# Patient Record
Sex: Male | Born: 1985 | Race: White | Marital: Single | Smoking: Current some day smoker
Health system: Southern US, Community
[De-identification: ages and names within clinical notes are randomized; demographics above are authoritative.]

## PROBLEM LIST (undated history)

## (undated) DIAGNOSIS — F1020 Alcohol dependence, uncomplicated: Secondary | ICD-10-CM

## (undated) DIAGNOSIS — F909 Attention-deficit hyperactivity disorder, unspecified type: Secondary | ICD-10-CM

## (undated) HISTORY — DX: Attention-deficit hyperactivity disorder, unspecified type: F90.9

---

## 2006-07-04 ENCOUNTER — Emergency Department: Payer: Self-pay | Admitting: Emergency Medicine

## 2015-05-07 ENCOUNTER — Emergency Department: Payer: Self-pay

## 2015-05-07 ENCOUNTER — Emergency Department
Admission: EM | Admit: 2015-05-07 | Discharge: 2015-05-07 | Disposition: A | Discharge status: Home / Self Care | Payer: Self-pay | Attending: Emergency Medicine | Admitting: Emergency Medicine

## 2015-05-07 DIAGNOSIS — F10920 Alcohol use, unspecified with intoxication, uncomplicated: Secondary | ICD-10-CM

## 2015-05-07 DIAGNOSIS — R000 Tachycardia, unspecified: Secondary | ICD-10-CM | POA: Insufficient documentation

## 2015-05-07 DIAGNOSIS — F10120 Alcohol abuse with intoxication, uncomplicated: Secondary | ICD-10-CM | POA: Insufficient documentation

## 2015-05-07 LAB — ACETAMINOPHEN LEVEL: Acetaminophen (Tylenol), Serum: <10 ug/mL — ABNORMAL LOW (ref 10–30)

## 2015-05-07 LAB — COMPREHENSIVE METABOLIC PANEL
ALT: 32 U/L (ref 17–63)
ANION GAP: 8 (ref 5–15)
AST: 38 U/L (ref 15–41)
Albumin: 4.6 g/dL (ref 3.5–5.0)
Alkaline Phosphatase: 125 U/L (ref 38–126)
BUN: 11 mg/dL (ref 6–20)
CHLORIDE: 110 mmol/L (ref 101–111)
CO2: 24 mmol/L (ref 22–32)
Calcium: 8.7 mg/dL — ABNORMAL LOW (ref 8.9–10.3)
Creatinine, Ser: 0.66 mg/dL (ref 0.61–1.24)
GFR calc non Af Amer: >60 mL/min (ref >60)
Glucose, Bld: 109 mg/dL — ABNORMAL HIGH (ref 65–99)
Potassium: 4 mmol/L (ref 3.5–5.1)
SODIUM: 142 mmol/L (ref 135–145)
Total Bilirubin: 0.5 mg/dL (ref 0.3–1.2)
Total Protein: 8.3 g/dL — ABNORMAL HIGH (ref 6.5–8.1)

## 2015-05-07 LAB — URINE DRUG SCREEN, QUALITATIVE (ARMC ONLY)
Amphetamines, Ur Screen: NOT DETECTED
Barbiturates, Ur Screen: NOT DETECTED
Benzodiazepine, Ur Scrn: NOT DETECTED
Cannabinoid 50 Ng, Ur ~~LOC~~: NOT DETECTED
Cocaine Metabolite,Ur ~~LOC~~: NOT DETECTED
MDMA (Ecstasy)Ur Screen: NOT DETECTED
Methadone Scn, Ur: NOT DETECTED
Opiate, Ur Screen: NOT DETECTED
Phencyclidine (PCP) Ur S: NOT DETECTED
Tricyclic, Ur Screen: NOT DETECTED

## 2015-05-07 LAB — CBC WITH DIFFERENTIAL/PLATELET
Basophils Absolute: 0.1 10*3/uL (ref 0–0.1)
Basophils Relative: 1 %
Eosinophils Absolute: 0.1 10*3/uL (ref 0–0.7)
Eosinophils Relative: 1 %
HCT: 45.5 % (ref 40.0–52.0)
Hemoglobin: 15.2 g/dL (ref 13.0–18.0)
LYMPHS ABS: 2 10*3/uL (ref 1.0–3.6)
LYMPHS PCT: 29 %
MCH: 32 pg (ref 26.0–34.0)
MCHC: 33.4 g/dL (ref 32.0–36.0)
MCV: 95.8 fL (ref 80.0–100.0)
MONO ABS: 0.4 10*3/uL (ref 0.2–1.0)
MONOS PCT: 6 %
NEUTROS ABS: 4.4 10*3/uL (ref 1.4–6.5)
Neutrophils Relative %: 63 %
PLATELETS: 267 10*3/uL (ref 150–440)
RBC: 4.74 MIL/uL (ref 4.40–5.90)
RDW: 12.4 % (ref 11.5–14.5)
WBC: 6.9 10*3/uL (ref 3.8–10.6)

## 2015-05-07 LAB — LIPASE, BLOOD: Lipase: 120 U/L — ABNORMAL HIGH (ref 22–51)

## 2015-05-07 LAB — SALICYLATE LEVEL

## 2015-05-07 LAB — ETHANOL: Alcohol, Ethyl (B): 542 mg/dL (ref <5)

## 2015-05-07 MED ORDER — ONDANSETRON HCL 4 MG/2ML IJ SOLN
INTRAMUSCULAR | Status: AC
  Administered 2015-05-07: 4 mg
  Filled 2015-05-07: qty 2

## 2015-05-07 MED ORDER — SODIUM CHLORIDE 0.9 % IV BOLUS (SEPSIS)
1000.0000 mL | Freq: Once | INTRAVENOUS | Status: AC
  Administered 2015-05-07: 1000 mL via INTRAVENOUS

## 2015-05-07 NOTE — ED Notes (Signed)
Patient transported to CT 

## 2015-05-07 NOTE — Discharge Instructions (Signed)
Alcohol Intoxication  Alcohol intoxication occurs when you drink enough alcohol that it affects your ability to function. It can be mild or very severe. Drinking a lot of alcohol in a short time is called binge drinking. This can be very harmful. Drinking alcohol can also be more dangerous if you are taking medicines or other drugs. Some of the effects caused by alcohol may include:  · Loss of coordination.  · Changes in mood and behavior.  · Unclear thinking.  · Trouble talking (slurred speech).  · Throwing up (vomiting).  · Confusion.  · Slowed breathing.  · Twitching and shaking (seizures).  · Loss of consciousness.  HOME CARE  · Do not drive after drinking alcohol.  · Drink enough water and fluids to keep your pee (urine) clear or pale yellow. Avoid caffeine.  · Only take medicine as told by your doctor.  GET HELP IF:  · You throw up (vomit) many times.  · You do not feel better after a few days.  · You frequently have alcohol intoxication. Your doctor can help decide if you should see a substance use treatment counselor.  GET HELP RIGHT AWAY IF:  · You become shaky when you stop drinking.  · You have twitching and shaking.  · You throw up blood. It may look bright red or like coffee grounds.  · You notice blood in your poop (bowel movements).  · You become lightheaded or pass out (faint).  MAKE SURE YOU:   · Understand these instructions.  · Will watch your condition.  · Will get help right away if you are not doing well or get worse.     This information is not intended to replace advice given to you by your health care provider. Make sure you discuss any questions you have with your health care provider.     Document Released: 12/24/2007 Document Revised: 03/09/2013 Document Reviewed: 12/10/2012  Elsevier Interactive Patient Education ©2016 [employer]    Alcohol Use Disorder  Alcohol use disorder is a mental disorder. It is not a one-time incident of heavy drinking. Alcohol use disorder is the excessive  and uncontrollable use of alcohol over time that leads to problems with functioning in one or more areas of daily living. People with this disorder risk harming themselves and others when they drink to excess. Alcohol use disorder also can cause other mental disorders, such as mood and anxiety disorders, and serious physical problems. People with alcohol use disorder often misuse other drugs.   Alcohol use disorder is common and widespread. Some people with this disorder drink alcohol to cope with or escape from negative life events. Others drink to relieve chronic pain or symptoms of mental illness. People with a family history of alcohol use disorder are at higher risk of losing control and using alcohol to excess.   Drinking too much alcohol can cause injury, accidents, and health problems. One drink can be too much when you are:  · Working.  · Pregnant or breastfeeding.  · Taking medicines. Ask your doctor.  · Driving or planning to drive.  SYMPTOMS   Signs and symptoms of alcohol use disorder may include the following:   · Consumption of alcohol in larger amounts or over a longer period of time than intended.  · Multiple unsuccessful attempts to cut down or control alcohol use.    · A great deal of time spent obtaining alcohol, using alcohol, or recovering from the effects of alcohol (hangover).  · A strong desire or   urge to use alcohol (cravings).    · Continued use of alcohol despite problems at work, school, or home because of alcohol use.    · Continued use of alcohol despite problems in relationships because of alcohol use.  · Continued use of alcohol in situations when it is physically hazardous, such as driving a car.  · Continued use of alcohol despite awareness of a physical or psychological problem that is likely related to alcohol use. Physical problems related to alcohol use can involve the brain, heart, liver, stomach, and intestines. Psychological problems related to alcohol use include  intoxication, depression, anxiety, psychosis, delirium, and dementia.    · The need for increased amounts of alcohol to achieve the same desired effect, or a decreased effect from the consumption of the same amount of alcohol (tolerance).  · Withdrawal symptoms upon reducing or stopping alcohol use, or alcohol use to reduce or avoid withdrawal symptoms. Withdrawal symptoms include:  ¨ Racing heart.  ¨ Hand tremor.  ¨ Difficulty sleeping.  ¨ Nausea.  ¨ Vomiting.  ¨ Hallucinations.  ¨ Restlessness.  ¨ Seizures.  DIAGNOSIS  Alcohol use disorder is diagnosed through an assessment by your health care provider. Your health care provider may start by asking three or four questions to screen for excessive or problematic alcohol use. To confirm a diagnosis of alcohol use disorder, at least two symptoms must be present within a 12-month period. The severity of alcohol use disorder depends on the number of symptoms:  · Mild--two or three.  · Moderate--four or five.  · Severe--six or more.  Your health care provider may perform a physical exam or use results from lab tests to see if you have physical problems resulting from alcohol use. Your health care provider may refer you to a mental health professional for evaluation.  TREATMENT   Some people with alcohol use disorder are able to reduce their alcohol use to low-risk levels. Some people with alcohol use disorder need to quit drinking alcohol. When necessary, mental health professionals with specialized training in substance use treatment can help. Your health care provider can help you decide how severe your alcohol use disorder is and what type of treatment you need. The following forms of treatment are available:   · Detoxification. Detoxification involves the use of prescription medicines to prevent alcohol withdrawal symptoms in the first week after quitting. This is important for people with a history of symptoms of withdrawal and for heavy drinkers who are likely to  have withdrawal symptoms. Alcohol withdrawal can be dangerous and, in severe cases, cause death. Detoxification is usually provided in a hospital or in-patient substance use treatment facility.  · Counseling or talk therapy. Talk therapy is provided by substance use treatment counselors. It addresses the reasons people use alcohol and ways to keep them from drinking again. The goals of talk therapy are to help people with alcohol use disorder find healthy activities and ways to cope with life stress, to identify and avoid triggers for alcohol use, and to handle cravings, which can cause relapse.  · Medicines. Different medicines can help treat alcohol use disorder through the following actions:    Decrease alcohol cravings.    Decrease the positive reward response felt from alcohol use.    Produce an uncomfortable physical reaction when alcohol is used (aversion therapy).  · Support groups. Support groups are run by people who have quit drinking. They provide emotional support, advice, and guidance.  These forms of treatment are often combined. Some people with   alcohol use disorder benefit from intensive combination treatment provided by specialized substance use treatment centers. Both inpatient and outpatient treatment programs are available.     This information is not intended to replace advice given to you by your health care provider. Make sure you discuss any questions you have with your health care provider.     Document Released: 08/14/2004 Document Revised: 07/28/2014 Document Reviewed: 10/14/2012  Elsevier Interactive Patient Education ©2016 [employer]

## 2015-05-07 NOTE — ED Notes (Signed)
Pts dad found him face down in the floor, unresponsive, pt smells strongly of etoh use. Pt answers to name, unable to state what he took before going unresponsive. Does not deny drug or etoh use. VSS.

## 2015-05-07 NOTE — ED Notes (Signed)
Pt's father, Bland SpanJoe Mascaro was called to come and get the Pt. He stated that he will pick the Pt up in a couple of minutes.

## 2015-05-07 NOTE — ED Provider Notes (Addendum)
[facility] Emergency Department Provider Note  ____________________________________________  Time seen: Seen upon arrival to the emergency department  I have reviewed the triage vital signs and the nursing notes.   HISTORY  Chief Complaint Alcohol Intoxication and Altered Mental Status    HPI Billy Frost is a 29 y.o. male with a history of polysubstance abuse per EMS was presenting today with an altered level of consciousness. It is unclear when the patient was last seen normal. He was found by his father facedown on the floor this afternoon at his home.Patient unable to give any further history at this time.   No past medical history on file.  There are no active problems to display for this patient.   No past surgical history on file.  No current outpatient prescriptions on file.  Allergies Review of patient's allergies indicates not on file.  No family history on file.  Social History Social History  Substance Use Topics  . Smoking status: Not on file  . Smokeless tobacco: Not on file  . Alcohol Use: Not on file    Review of Systems  Caveat secondary to altered mental status.  ____________________________________________   PHYSICAL EXAM:  VITAL SIGNS: ED Triage Vitals  Enc Vitals Group     BP 05/07/15 1734 157/80 mmHg     Pulse Rate 05/07/15 1734 108     Resp 05/07/15 1734 18     Temp 05/07/15 1734 98.4 F (36.9 C)     Temp Source 05/07/15 1734 Oral     SpO2 05/07/15 1734 96 %     Weight 05/07/15 1734 240 lb (108.863 kg)     Height 05/07/15 1734  (1.88 m)     Head Cir --      Peak Flow --      Pain Score --      Pain Loc --      Pain Edu? --      Excl. in GC? --     Constitutional: Alert with eyes open and moving all 4 extremities spontaneously. However, appears intoxicated and not following commands. Eyes: Conjunctivae are normal. PERRL. EOMI. Head: Atraumatic. Nose: No congestion/rhinnorhea. Mouth/Throat:  Mucous membranes are moist.  Oropharynx non-erythematous. Neck: No stridor.  The tenderness to the C-spine. Cardiovascular: Tachycardic, regular rhythm. Grossly normal heart sounds.  Good peripheral circulation. Respiratory: Normal respiratory effort.  No retractions. Lungs CTAB. Gastrointestinal: Soft and nontender. No distention. No abdominal bruits. No CVA tenderness. Musculoskeletal: No lower extremity tenderness nor edema.  No joint effusions. Neurologic:  Normal speech and language. No gross focal neurologic deficits are appreciated. No gait instability. Skin:  Skin is warm, dry and intact. No rash noted. Psychiatric: Mood and affect are normal. Speech and behavior are normal.  ____________________________________________   LABS (all labs ordered are listed, but only abnormal results are displayed)  Labs Reviewed  COMPREHENSIVE METABOLIC PANEL - Abnormal; Notable for the following:    Glucose, Bld 109 (*)    Calcium 8.7 (*)    Total Protein 8.3 (*)    All other components within normal limits  LIPASE, BLOOD - Abnormal; Notable for the following:    Lipase 120 (*)    All other components within normal limits  ETHANOL - Abnormal; Notable for the following:    Alcohol, Ethyl (B) 542 (*)    All other components within normal limits  ACETAMINOPHEN LEVEL - Abnormal; Notable for the following:    Acetaminophen (Tylenol), Serum <10 (*)  All other components within normal limits  CBC WITH DIFFERENTIAL/PLATELET  URINE DRUG SCREEN, QUALITATIVE ([facility] ONLY)  SALICYLATE LEVEL   ____________________________________________  EKG  ED ECG REPORT I, Schaevitz,  Teena Iraniavid M, the attending physician, personally viewed and interpreted this ECG.   Date: 05/07/2015  EKG Time: 1724  Rate: 101  Rhythm: sinus tachycardia  Axis: Normal  Intervals:none  ST&T Change: No ST segment elevation or depression. No abnormal T-wave  inversion.  ____________________________________________  RADIOLOGY  Chest x-ray with age-indeterminate left clavicular fracture. Also with remote left rib fractures. No acute abnormality on the pelvis x-ray. CAT scan without any acute intracranial or cervical spine abnormalities. Subacute healing fracture to the middle left third clavicle. ____________________________________________   PROCEDURES    ____________________________________________   INITIAL IMPRESSION / ASSESSMENT AND PLAN / ED COURSE  Pertinent labs & imaging results that were available during my care of the patient were reviewed by me and considered in my medical decision making (see chart for details).  ----------------------------------------- 11:13 PM on 05/07/2015 -----------------------------------------  For about the past hour the patient has been up and pacing his room. We had called his father who is now at the bedside. The patient is alert and oriented 3. He notes that he does not have any clavicular pain and that this was a remote fracture. I was able to palpate both clavicles of patient having any tenderness or crepitus. His father feels comfortable taking him home at this time. The patient is now admitting to daily drinking. I will give him follow-up information for addiction services as well as a primary care doctor. I discussed the father supervising him closely overnight to make sure that he does not vomit or aspirate. The father says that he would like to take him home at this time and that he will be able to supervise him until he is sober. ____________________________________________   FINAL CLINICAL IMPRESSION(S) / ED DIAGNOSES  Alcohol intoxication.    Myrna Blazeravid Matthew Schaevitz, MD 05/07/15 2316  The patient does appear to be still clinically intoxicated but since he is awake and alert at this time is able to walk without assistance he'll be appropriate for discharge to home under his  father's supervision.  Myrna Blazeravid Matthew Schaevitz, MD 05/07/15 [phone]

## 2015-05-07 NOTE — ED Notes (Signed)
Pt ripped his IV against medical advice and tried to leave the ED. Security was called and the Pt was redirected to the room.

## 2015-05-07 NOTE — ED Notes (Signed)
Lab called with critical ETOH 542, Dr.Schaevitz aware

## 2015-05-07 NOTE — ED Notes (Signed)
Pt assisted to stand to use urinal at bedside with 2 assist. Tolerated well, speech remains slurred.

## 2015-05-07 NOTE — ED Notes (Signed)
Pt woke up and was reoriented to time and space. ED MD talked to the Pt and told him that he can not go home until he is no longer intoxicated. Pt was ambulated in the room and stumbled while walking.

## 2015-05-07 NOTE — ED Notes (Signed)
Pt smiling, denies pain, answers to his name only. Unable to state what and how much he drank at this time.

## 2015-05-17 ENCOUNTER — Emergency Department: Payer: Self-pay

## 2015-05-17 ENCOUNTER — Encounter: Payer: Self-pay | Admitting: Emergency Medicine

## 2015-05-17 ENCOUNTER — Emergency Department
Admission: EM | Admit: 2015-05-17 | Discharge: 2015-05-17 | Discharge status: Against Medical Advice | Payer: Self-pay | Attending: Emergency Medicine | Admitting: Emergency Medicine

## 2015-05-17 DIAGNOSIS — E876 Hypokalemia: Secondary | ICD-10-CM | POA: Insufficient documentation

## 2015-05-17 DIAGNOSIS — M25512 Pain in left shoulder: Secondary | ICD-10-CM | POA: Insufficient documentation

## 2015-05-17 DIAGNOSIS — M25519 Pain in unspecified shoulder: Secondary | ICD-10-CM

## 2015-05-17 DIAGNOSIS — R000 Tachycardia, unspecified: Secondary | ICD-10-CM | POA: Insufficient documentation

## 2015-05-17 DIAGNOSIS — R42 Dizziness and giddiness: Secondary | ICD-10-CM | POA: Insufficient documentation

## 2015-05-17 LAB — CBC WITH DIFFERENTIAL/PLATELET
BASOS PCT: 1 %
Basophils Absolute: 0.1 10*3/uL (ref 0–0.1)
EOS ABS: 0 10*3/uL (ref 0–0.7)
EOS PCT: 0 %
HEMATOCRIT: 45.2 % (ref 40.0–52.0)
Hemoglobin: 15.4 g/dL (ref 13.0–18.0)
Lymphocytes Relative: 10 %
Lymphs Abs: 1 10*3/uL (ref 1.0–3.6)
MCH: 32 pg (ref 26.0–34.0)
MCHC: 34.2 g/dL (ref 32.0–36.0)
MCV: 93.6 fL (ref 80.0–100.0)
MONO ABS: 0.9 10*3/uL (ref 0.2–1.0)
MONOS PCT: 9 %
Neutro Abs: 8.8 10*3/uL — ABNORMAL HIGH (ref 1.4–6.5)
Neutrophils Relative %: 80 %
PLATELETS: 232 10*3/uL (ref 150–440)
RBC: 4.83 MIL/uL (ref 4.40–5.90)
RDW: 12.3 % (ref 11.5–14.5)
WBC: 10.9 10*3/uL — ABNORMAL HIGH (ref 3.8–10.6)

## 2015-05-17 LAB — COMPREHENSIVE METABOLIC PANEL
ALBUMIN: 5 g/dL (ref 3.5–5.0)
ALK PHOS: 101 U/L (ref 38–126)
ALT: 46 U/L (ref 17–63)
AST: 54 U/L — AB (ref 15–41)
Anion gap: 16 — ABNORMAL HIGH (ref 5–15)
BILIRUBIN TOTAL: 3.5 mg/dL — AB (ref 0.3–1.2)
BUN: 18 mg/dL (ref 6–20)
CALCIUM: 9.7 mg/dL (ref 8.9–10.3)
CO2: 24 mmol/L (ref 22–32)
Chloride: 92 mmol/L — ABNORMAL LOW (ref 101–111)
Creatinine, Ser: 0.84 mg/dL (ref 0.61–1.24)
GFR calc Af Amer: >60 mL/min (ref >60)
GLUCOSE: 82 mg/dL (ref 65–99)
Potassium: 2.4 mmol/L — CL (ref 3.5–5.1)
Sodium: 132 mmol/L — ABNORMAL LOW (ref 135–145)
TOTAL PROTEIN: 9.2 g/dL — AB (ref 6.5–8.1)

## 2015-05-17 LAB — MAGNESIUM: MAGNESIUM: 2.1 mg/dL (ref 1.7–2.4)

## 2015-05-17 LAB — GLUCOSE, CAPILLARY: Glucose-Capillary: 86 mg/dL (ref 65–99)

## 2015-05-17 LAB — TROPONIN I: Troponin I: 0.03 ng/mL (ref <0.031)

## 2015-05-17 LAB — ETHANOL: Alcohol, Ethyl (B): <5 mg/dL (ref <5)

## 2015-05-17 LAB — LACTIC ACID, PLASMA
LACTIC ACID, VENOUS: 1.2 mmol/L (ref 0.5–2.0)
LACTIC ACID, VENOUS: 0.8 mmol/L (ref 0.5–2.0)

## 2015-05-17 MED ORDER — SODIUM CHLORIDE 0.9 % IV SOLN
Freq: Once | INTRAVENOUS | Status: AC
  Administered 2015-05-17: 16:00:00 via INTRAVENOUS

## 2015-05-17 MED ORDER — NAPROXEN 500 MG PO TABS: 500.0000 mg | ORAL_TABLET | Freq: Two times a day (BID) | ORAL | Status: AC

## 2015-05-17 MED ORDER — POTASSIUM CHLORIDE IN NACL 20-0.9 MEQ/L-% IV SOLN
Freq: Once | INTRAVENOUS | Status: AC
  Administered 2015-05-17: 17:00:00 via INTRAVENOUS

## 2015-05-17 MED ORDER — ONDANSETRON HCL 4 MG/2ML IJ SOLN
4.0000 mg | Freq: Once | INTRAMUSCULAR | Status: AC
  Administered 2015-05-17: 4 mg via INTRAVENOUS
  Filled 2015-05-17: qty 2

## 2015-05-17 MED ORDER — POTASSIUM CHLORIDE 20 MEQ/15ML (10%) PO SOLN
40.0000 meq | Freq: Once | ORAL | Status: AC
  Administered 2015-05-17: 40 meq via ORAL
  Filled 2015-05-17: qty 30

## 2015-05-17 MED ORDER — POTASSIUM CHLORIDE IN NACL 20-0.9 MEQ/L-% IV SOLN
Freq: Once | INTRAVENOUS | Status: DC
  Filled 2015-05-17: qty 1000

## 2015-05-17 MED ORDER — MORPHINE SULFATE (PF) 4 MG/ML IV SOLN
4.0000 mg | Freq: Once | INTRAVENOUS | Status: AC
  Administered 2015-05-17: 4 mg via INTRAVENOUS
  Filled 2015-05-17: qty 1

## 2015-05-17 MED ORDER — POTASSIUM CHLORIDE IN NACL 20-0.9 MEQ/L-% IV SOLN
INTRAVENOUS | Status: AC
  Filled 2015-05-17: qty 1000

## 2015-05-17 NOTE — ED Notes (Signed)
Patient transported to X-ray 

## 2015-05-17 NOTE — ED Notes (Signed)
Pt refusing further treatment at this time and wants to leave AMA.  Pt advised by this RN and Dr. Darnelle CatalanMalinda and Dr. Cyril LoosenKinner consequences of leaving AMA.  Pt states verbal understanding, accepting discharge paperwork, and signed AMA form.  Per Dr. Darnelle CatalanMalinda "unsure if anything is seriously wrong at this point, patient has multiple lab abnormalities, pt verbally understands consequences of leaving AMA.

## 2015-05-17 NOTE — ED Notes (Signed)
Pt here with c/o left shoulder pain since this morning, reports he broke his collar bone 1 month ago. Pt with sling in tact.

## 2015-05-17 NOTE — ED Provider Notes (Signed)
Patient is refusing to stay any longer despite his continued tachycardia and no obvious etiology. He is leaving AGAINST MEDICAL ADVICE. He understands the risk of doing so including death and disability.  Billy Everyobert Chi Garlow, MD 05/17/15 [phone]

## 2015-05-17 NOTE — ED Provider Notes (Addendum)
[facility] Emergency Department Provider Note  ____________________________________________  Time seen: Approximately 2:39 PM  I have reviewed the triage vital signs and the nursing notes.   HISTORY  Chief Complaint Shoulder Pain    HPI Billy Frost is a 29 y.o. male who was recently sent to fast track. There he was found to be tachycardic clammy and cool to his transfer to the major side. Patient reports that he fractured his left clavicle on August 24. He's been using the sling but yesterday or last night he slept on his left side woke up with a lot more pain trying to stretch his arm up and he was unable to today usually he can do so he reports a lot of pain in the area of the clavicle and more swelling there than usual. Patient also reports he is somewhat lightheaded and shaky today. Patient has a history of alcohol abuse. He has not had any alcohol for a week and a half however and was normal yesterday. Patient reports the pain in the shoulders moderately severe. Lightheadedness is mild to moderate he has no other complaints or pain or discomfort anywhere else.   History reviewed. No pertinent past medical history. Clavicle fracture There are no active problems to display for this patient.   History reviewed. No pertinent past surgical history.  Current Outpatient Rx  Name  Route  Sig  Dispense  Refill  . amphetamine-dextroamphetamine (ADDERALL) 30 MG tablet   Oral   Take 60 mg by mouth 2 (two) times daily as needed (for ADHD).           Allergies Review of patient's allergies indicates no known allergies.  No family history on file.  Social History Social History  Substance Use Topics  . Smoking status: Never Smoker   . Smokeless tobacco: None  . Alcohol Use: No   please see history of present illness   Review of Systems Constitutional: No fever/chills Eyes: No visual changes. ENT: No sore throat. Cardiovascular: Denies chest  pain. Respiratory: Denies shortness of breath. Gastrointestinal: No abdominal pain.  No nausea, no vomiting.  No diarrhea.  No constipation. Genitourinary: Negative for dysuria. Musculoskeletal: Negative for back pain. Skin: Negative for rash. Neurological: Negative for headaches, focal weakness or numbness.  10-point ROS otherwise negative.  ____________________________________________   PHYSICAL EXAM:  VITAL SIGNS: ED Triage Vitals  Enc Vitals Group     BP 05/17/15 1401 151/94 mmHg     Pulse Rate 05/17/15 1401 116     Resp 05/17/15 1401 16     Temp 05/17/15 1401 98.3 F (36.8 C)     Temp Source 05/17/15 1401 Oral     SpO2 05/17/15 1401 100 %     Weight 05/17/15 1401 229 lb (103.874 kg)     Height 05/17/15 1401 6' (1.829 m)     Head Cir --      Peak Flow --      Pain Score 05/17/15 1402 7     Pain Loc --      Pain Edu? --      Excl. in GC? --     Constitutional: Alert and oriented. Flushed and clammy and tachycardic and in no acute distress. Eyes: Conjunctivae are normal. PERRL. EOMI. Head: Atraumatic. Nose: No congestion/rhinnorhea. Mouth/Throat: Mucous membranes are moist.  Oropharynx non-erythematous. Neck: No stridor.  Cardiovascular: Normal rate, regular rhythm. Grossly normal heart sounds.  Good peripheral circulation. Respiratory: Normal respiratory effort.  No retractions. Lungs CTAB. Left  clavicle is swollen and not particularly tender to touch but hurts a lot if he attempts to move the arm. Gastrointestinal: Soft and nontender. No distention. No abdominal bruits. No CVA tenderness. Musculoskeletal: No lower extremity tenderness nor edema.  No joint effusions. Pulses are normal in both wrists. Sensation is normal in both wrists. Neurologic:  Normal speech and language. No gross focal neurologic deficits are appreciated. No gait instability. Skin:  Skin is flushed and clammy. Psychiatric: Mood and affect are normal. Speech and behavior are  normal.  ____________________________________________   LABS (all labs ordered are listed, but only abnormal results are displayed)  Labs Reviewed  COMPREHENSIVE METABOLIC PANEL - Abnormal; Notable for the following:    Sodium 132 (*)    Potassium 2.4 (*)    Chloride 92 (*)    Total Protein 9.2 (*)    AST 54 (*)    Total Bilirubin 3.5 (*)    Anion gap 16 (*)    All other components within normal limits  CBC WITH DIFFERENTIAL/PLATELET - Abnormal; Notable for the following:    WBC 10.9 (*)    Neutro Abs 8.8 (*)    All other components within normal limits  GLUCOSE, CAPILLARY  TROPONIN I  LACTIC ACID, PLASMA  LACTIC ACID, PLASMA  URINE DRUG SCREEN, QUALITATIVE ([facility] ONLY)  ETHANOL   ____________________________________________  EKG  EKG read and interpreted by me shows sinus tach at a rate of 111 heart rate on the monitor septic 128. EKG shows normal axis no acute changes. I should add that the baseline is not very good. ____________________________________________  RADIOLOGY  Chest x-ray read as healing clavicle fracture ____________________________________________   PROCEDURES    ____________________________________________   INITIAL IMPRESSION / ASSESSMENT AND PLAN / ED COURSE  Pertinent labs & imaging results that were available during my care of the patient were reviewed by me and considered in my medical decision making (see chart for details).  Patient insists he has not had any drugs or alcohol except his Adderall that he's prescribed since a week and half ago. He insists he was normal yesterday and not sure why he is tachycardic and clammy we'll give him some pain medicine and fluids and see what happens patient will be signed out to Dr. Cyril LoosenKinner.  I spoke briefly to Dr. Martha ClanKrasinski about the patient.Marland Kitchen. He feels that the x-rays are okay from the orthopedic point of view the patient can get high-dose nonsteroidals sling and follow-up in the office. I discussed  the case with Dr. Cyril LoosenKinner he will go and evaluate the patient and this I will sign the patient out to him for further evaluation. ____________________________________________ ----------------------------------------- 4:50 PM on 05/17/2015 -----------------------------------------  Patient insists he will not stay any longer in the hospital. I discussed it with Dr. Martha ClanKrasinski who feels as I mentioned before nonsteroidals in a sling will work. Dr. Clelia CroftShaw feels some B some IV potassium and fluids should improve his tachycardia and hypokalemia. He will come down and see the patient after the IV fluids if the patient will stay that long. The patient keeps threatening that he has to leave and he can spend the night. He is competent he is not intoxicated he is aware of the risk of leaving and insists that is only because he took the Adde rall that his heart rate is up. He is aware of the other lab abnormalities and says he feels fine he's always been fine and he wants to go home.   FINAL CLINICAL IMPRESSION(S) /  ED DIAGNOSES  Final diagnoses:  Tachycardia  Hypokalemia  Shoulder pain, unspecified laterality      Arnaldo Natal, MD 05/17/15 1610  Arnaldo Natal, MD 05/17/15 [phone]

## 2015-05-17 NOTE — ED Notes (Signed)
Called pharmacy to send pt medication. 

## 2015-05-17 NOTE — ED Notes (Signed)
Pt refusing to give urine sample at this time

## 2015-05-28 ENCOUNTER — Ambulatory Visit: Payer: Self-pay | Admitting: Family Medicine

## 2015-06-04 ENCOUNTER — Ambulatory Visit: Payer: Self-pay | Admitting: Family Medicine

## 2015-07-06 ENCOUNTER — Encounter: Payer: Self-pay | Admitting: Emergency Medicine

## 2015-07-06 ENCOUNTER — Other Ambulatory Visit: Payer: Self-pay

## 2015-07-06 ENCOUNTER — Emergency Department
Admission: EM | Admit: 2015-07-06 | Discharge: 2015-07-06 | Disposition: A | Discharge status: Home / Self Care | Payer: Self-pay | Attending: Emergency Medicine | Admitting: Emergency Medicine

## 2015-07-06 DIAGNOSIS — M25512 Pain in left shoulder: Secondary | ICD-10-CM | POA: Insufficient documentation

## 2015-07-06 DIAGNOSIS — F1010 Alcohol abuse, uncomplicated: Secondary | ICD-10-CM | POA: Insufficient documentation

## 2015-07-06 DIAGNOSIS — Z791 Long term (current) use of non-steroidal anti-inflammatories (NSAID): Secondary | ICD-10-CM | POA: Insufficient documentation

## 2015-07-06 DIAGNOSIS — F1110 Opioid abuse, uncomplicated: Secondary | ICD-10-CM | POA: Insufficient documentation

## 2015-07-06 LAB — COMPREHENSIVE METABOLIC PANEL
ALBUMIN: 4.6 g/dL (ref 3.5–5.0)
ALK PHOS: 101 U/L (ref 38–126)
ALT: 27 U/L (ref 17–63)
ANION GAP: 9 (ref 5–15)
AST: 30 U/L (ref 15–41)
BUN: 10 mg/dL (ref 6–20)
CALCIUM: 8.8 mg/dL — AB (ref 8.9–10.3)
CO2: 24 mmol/L (ref 22–32)
Chloride: 111 mmol/L (ref 101–111)
Creatinine, Ser: 0.73 mg/dL (ref 0.61–1.24)
GFR calc Af Amer: >60 mL/min (ref >60)
GFR calc non Af Amer: >60 mL/min (ref >60)
GLUCOSE: 99 mg/dL (ref 65–99)
Potassium: 3.9 mmol/L (ref 3.5–5.1)
SODIUM: 144 mmol/L (ref 135–145)
Total Bilirubin: 0.8 mg/dL (ref 0.3–1.2)
Total Protein: 8.6 g/dL — ABNORMAL HIGH (ref 6.5–8.1)

## 2015-07-06 LAB — URINE DRUG SCREEN, QUALITATIVE (ARMC ONLY)
Amphetamines, Ur Screen: NOT DETECTED
BARBITURATES, UR SCREEN: NOT DETECTED
Benzodiazepine, Ur Scrn: NOT DETECTED
COCAINE METABOLITE, UR ~~LOC~~: NOT DETECTED
Cannabinoid 50 Ng, Ur ~~LOC~~: NOT DETECTED
MDMA (Ecstasy)Ur Screen: NOT DETECTED
METHADONE SCREEN, URINE: NOT DETECTED
OPIATE, UR SCREEN: NOT DETECTED
Phencyclidine (PCP) Ur S: NOT DETECTED
Tricyclic, Ur Screen: NOT DETECTED

## 2015-07-06 LAB — TROPONIN I: Troponin I: <0.03 ng/mL (ref <0.031)

## 2015-07-06 LAB — CBC
HCT: 43.3 % (ref 40.0–52.0)
HEMOGLOBIN: 14.6 g/dL (ref 13.0–18.0)
MCH: 31.9 pg (ref 26.0–34.0)
MCHC: 33.7 g/dL (ref 32.0–36.0)
MCV: 94.9 fL (ref 80.0–100.0)
Platelets: 259 10*3/uL (ref 150–440)
RBC: 4.56 MIL/uL (ref 4.40–5.90)
RDW: 12.7 % (ref 11.5–14.5)
WBC: 11.1 10*3/uL — AB (ref 3.8–10.6)

## 2015-07-06 NOTE — Discharge Instructions (Signed)
Please seek medical attention for any high fevers, chest pain, shortness of breath, change in behavior, persistent vomiting, bloody stool or any other new or concerning symptoms. ° ° °Alcohol Use Disorder °Alcohol use disorder is a mental disorder. It is not a one-time incident of heavy drinking. Alcohol use disorder is the excessive and uncontrollable use of alcohol over time that leads to problems with functioning in one or more areas of daily living. People with this disorder risk harming themselves and others when they drink to excess. Alcohol use disorder also can cause other mental disorders, such as mood and anxiety disorders, and serious physical problems. People with alcohol use disorder often misuse other drugs.  °Alcohol use disorder is common and widespread. Some people with this disorder drink alcohol to cope with or escape from negative life events. Others drink to relieve chronic pain or symptoms of mental illness. People with a family history of alcohol use disorder are at higher risk of losing control and using alcohol to excess.  °Drinking too much alcohol can cause injury, accidents, and health problems. One drink can be too much when you are: °· Working. °· Pregnant or breastfeeding. °· Taking medicines. Ask your doctor. °· Driving or planning to drive. °SYMPTOMS  °Signs and symptoms of alcohol use disorder may include the following:  °· Consumption of alcohol in larger amounts or over a longer period of time than intended. °· Multiple unsuccessful attempts to cut down or control alcohol use.   °· A great deal of time spent obtaining alcohol, using alcohol, or recovering from the effects of alcohol (hangover). °· A strong desire or urge to use alcohol (cravings).   °· Continued use of alcohol despite problems at work, school, or home because of alcohol use.   °· Continued use of alcohol despite problems in relationships because of alcohol use. °· Continued use of alcohol in situations when it is  physically hazardous, such as driving a car. °· Continued use of alcohol despite awareness of a physical or psychological problem that is likely related to alcohol use. Physical problems related to alcohol use can involve the brain, heart, liver, stomach, and intestines. Psychological problems related to alcohol use include intoxication, depression, anxiety, psychosis, delirium, and dementia.   °· The need for increased amounts of alcohol to achieve the same desired effect, or a decreased effect from the consumption of the same amount of alcohol (tolerance). °· Withdrawal symptoms upon reducing or stopping alcohol use, or alcohol use to reduce or avoid withdrawal symptoms. Withdrawal symptoms include: °¨ Racing heart. °¨ Hand tremor. °¨ Difficulty sleeping. °¨ Nausea. °¨ Vomiting. °¨ Hallucinations. °¨ Restlessness. °¨ Seizures. °DIAGNOSIS °Alcohol use disorder is diagnosed through an assessment by your health care provider. Your health care provider may start by asking three or four questions to screen for excessive or problematic alcohol use. To confirm a diagnosis of alcohol use disorder, at least two symptoms must be present within a 12-month period. The severity of alcohol use disorder depends on the number of symptoms: °· Mild--two or three. °· Moderate--four or five. °· Severe--six or more. °Your health care provider may perform a physical exam or use results from lab tests to see if you have physical problems resulting from alcohol use. Your health care provider may refer you to a mental health professional for evaluation. °TREATMENT  °Some people with alcohol use disorder are able to reduce their alcohol use to low-risk levels. Some people with alcohol use disorder need to quit drinking alcohol. When necessary, mental health professionals with specialized   training in substance use treatment can help. Your health care provider can help you decide how severe your alcohol use disorder is and what type of  treatment you need. The following forms of treatment are available:  °· Detoxification. Detoxification involves the use of prescription medicines to prevent alcohol withdrawal symptoms in the first week after quitting. This is important for people with a history of symptoms of withdrawal and for heavy drinkers who are likely to have withdrawal symptoms. Alcohol withdrawal can be dangerous and, in severe cases, cause death. Detoxification is usually provided in a hospital or in-patient substance use treatment facility. °· Counseling or talk therapy. Talk therapy is provided by substance use treatment counselors. It addresses the reasons people use alcohol and ways to keep them from drinking again. The goals of talk therapy are to help people with alcohol use disorder find healthy activities and ways to cope with life stress, to identify and avoid triggers for alcohol use, and to handle cravings, which can cause relapse. °· Medicines. Different medicines can help treat alcohol use disorder through the following actions: °¨ Decrease alcohol cravings. °¨ Decrease the positive reward response felt from alcohol use. °¨ Produce an uncomfortable physical reaction when alcohol is used (aversion therapy). °· Support groups. Support groups are run by people who have quit drinking. They provide emotional support, advice, and guidance. °These forms of treatment are often combined. Some people with alcohol use disorder benefit from intensive combination treatment provided by specialized substance use treatment centers. Both inpatient and outpatient treatment programs are available. °  °This information is not intended to replace advice given to you by your health care provider. Make sure you discuss any questions you have with your health care provider. °  °Document Released: 08/14/2004 Document Revised: 07/28/2014 Document Reviewed: 10/14/2012 °Elsevier Interactive Patient Education ©2016 [employer] ° °

## 2015-07-06 NOTE — ED Provider Notes (Signed)
[facility] Emergency Department Provider Note   ____________________________________________  Time seen: 1720  I have reviewed the triage vital signs and the nursing notes.   HISTORY  Chief Complaint Medical Clearance   History limited by: Not Limited   HPI Billy Frost is a 29 y.o. male who presents to the emergency department today under police custody. The patient states that he last drank alcohol last night. He states he has recently used heroin and cocaine. Patient states that the police arrested him because of a DUI. Patient's main complaint is of left shoulder pain. Patient states that he broke his collarbone roughly 2 months ago. He states he continues to have pain. He has had no fevers. Denies any other chest pain. Denies any desire to detox from alcohol or drugs.   Past Medical History  Diagnosis Date  . ADHD (attention deficit hyperactivity disorder)     There are no active problems to display for this patient.   No past surgical history on file.  Current Outpatient Rx  Name  Route  Sig  Dispense  Refill  . amphetamine-dextroamphetamine (ADDERALL) 30 MG tablet   Oral   Take 60 mg by mouth 2 (two) times daily as needed (for ADHD).         . naproxen (NAPROSYN) 500 MG tablet   Oral   Take 1 tablet (500 mg total) by mouth 2 (two) times daily with a meal.   60 tablet   2     Allergies Review of patient's allergies indicates no known allergies.  Family History  Problem Relation Age of Onset  . Heart disease Father     Social History Social History  Substance Use Topics  . Smoking status: Never Smoker   . Smokeless tobacco: None  . Alcohol Use: Yes    Review of Systems  Constitutional: Negative for fever. Cardiovascular: Negative for chest pain. Respiratory: Negative for shortness of breath. Gastrointestinal: Negative for abdominal pain, vomiting and diarrhea. Genitourinary: Negative for dysuria. Musculoskeletal:  Negative for back pain. Positive for left shoulder pain Skin: Negative for rash. Neurological: Negative for headaches, focal weakness or numbness.  10-point ROS otherwise negative.  ____________________________________________   PHYSICAL EXAM:  VITAL SIGNS: ED Triage Vitals  Enc Vitals Group     BP 07/06/15 1628 147/83 mmHg     Pulse Rate 07/06/15 1628 110     Resp 07/06/15 1628 20     Temp 07/06/15 1628 98.6 F (37 C)     Temp Source 07/06/15 1628 Oral     SpO2 07/06/15 1628 96 %     Weight 07/06/15 1628 218 lb (98.884 kg)     Height 07/06/15 1628  (1.854 m)     Head Cir --      Peak Flow --      Pain Score 07/06/15 1641 9   Constitutional: Awake and alert. Appears intoxicated. Eyes: Conjunctivae are normal. PERRL. Normal extraocular movements. ENT   Head: Normocephalic and atraumatic.   Nose: No congestion/rhinnorhea.   Mouth/Throat: Mucous membranes are moist.   Neck: No stridor. Hematological/Lymphatic/Immunilogical: No cervical lymphadenopathy. Cardiovascular: Normal rate, regular rhythm.  No murmurs, rubs, or gallops. Respiratory: Normal respiratory effort without tachypnea nor retractions. Breath sounds are clear and equal bilaterally. No wheezes/rales/rhonchi. Gastrointestinal: Soft and nontender. No distention.  Genitourinary: Deferred Musculoskeletal: Normal range of motion in all extremities. No joint effusions.  No lower extremity tenderness nor edema. Neurologic:  Normal speech and language. No gross focal neurologic  deficits are appreciated.  Skin:  Skin is warm, dry and intact. No rash noted. ____________________________________________    LABS (pertinent positives/negatives)  Labs Reviewed  CBC - Abnormal; Notable for the following:    WBC 11.1 (*)    All other components within normal limits  COMPREHENSIVE METABOLIC PANEL - Abnormal; Notable for the following:    Calcium 8.8 (*)    Total Protein 8.6 (*)    All other components  within normal limits  TROPONIN I  URINE DRUG SCREEN, QUALITATIVE ([facility] ONLY)     ____________________________________________   EKG  I, Phineas SemenGraydon Denetta Fei, attending physician, personally viewed and interpreted this EKG  EKG Time: 1646 Rate: 107 Rhythm: sinus tachycardia Axis: left axis deviation Intervals: qtc 445 QRS: narrow ST changes: no st elevation Impression: abnormal ekg ____________________________________________    RADIOLOGY  None  ____________________________________________   PROCEDURES  Procedure(s) performed: None  Critical Care performed: No  ____________________________________________   INITIAL IMPRESSION / ASSESSMENT AND PLAN / ED COURSE  Pertinent labs & imaging results that were available during my care of the patient were reviewed by me and considered in my medical decision making (see chart for details).  Patient presented to the emergency department and a police custody complaining primarily of continued left shoulder pain where he had a broken collarbone. On exam patient in no acute distress although he does appear to be slightly intoxicated. Blood work without any overly concerning findings. Will discharge to police custody.  ____________________________________________   FINAL CLINICAL IMPRESSION(S) / ED DIAGNOSES  Final diagnoses:  Alcohol abuse  Heroin abuse  Left shoulder pain     Phineas SemenGraydon Sylus Stgermain, MD 07/06/15 1731

## 2015-07-06 NOTE — ED Notes (Signed)
Patient presents to the ED in police custody.  Patient reports using meth, cocaine, and heroin at 4am this morning.  Patient reports chest pain but also reports history of a broken collar bone 2 months ago.  Patient is not cooperative or behaving appropriately.

## 2017-01-25 IMAGING — CR DG CHEST 1V
1 series · 1 of 1 positions shown · non-contrast
Comparison: None.

CLINICAL DATA: Fall

EXAM:
CHEST 1 VIEW

[dg chest 1 view]
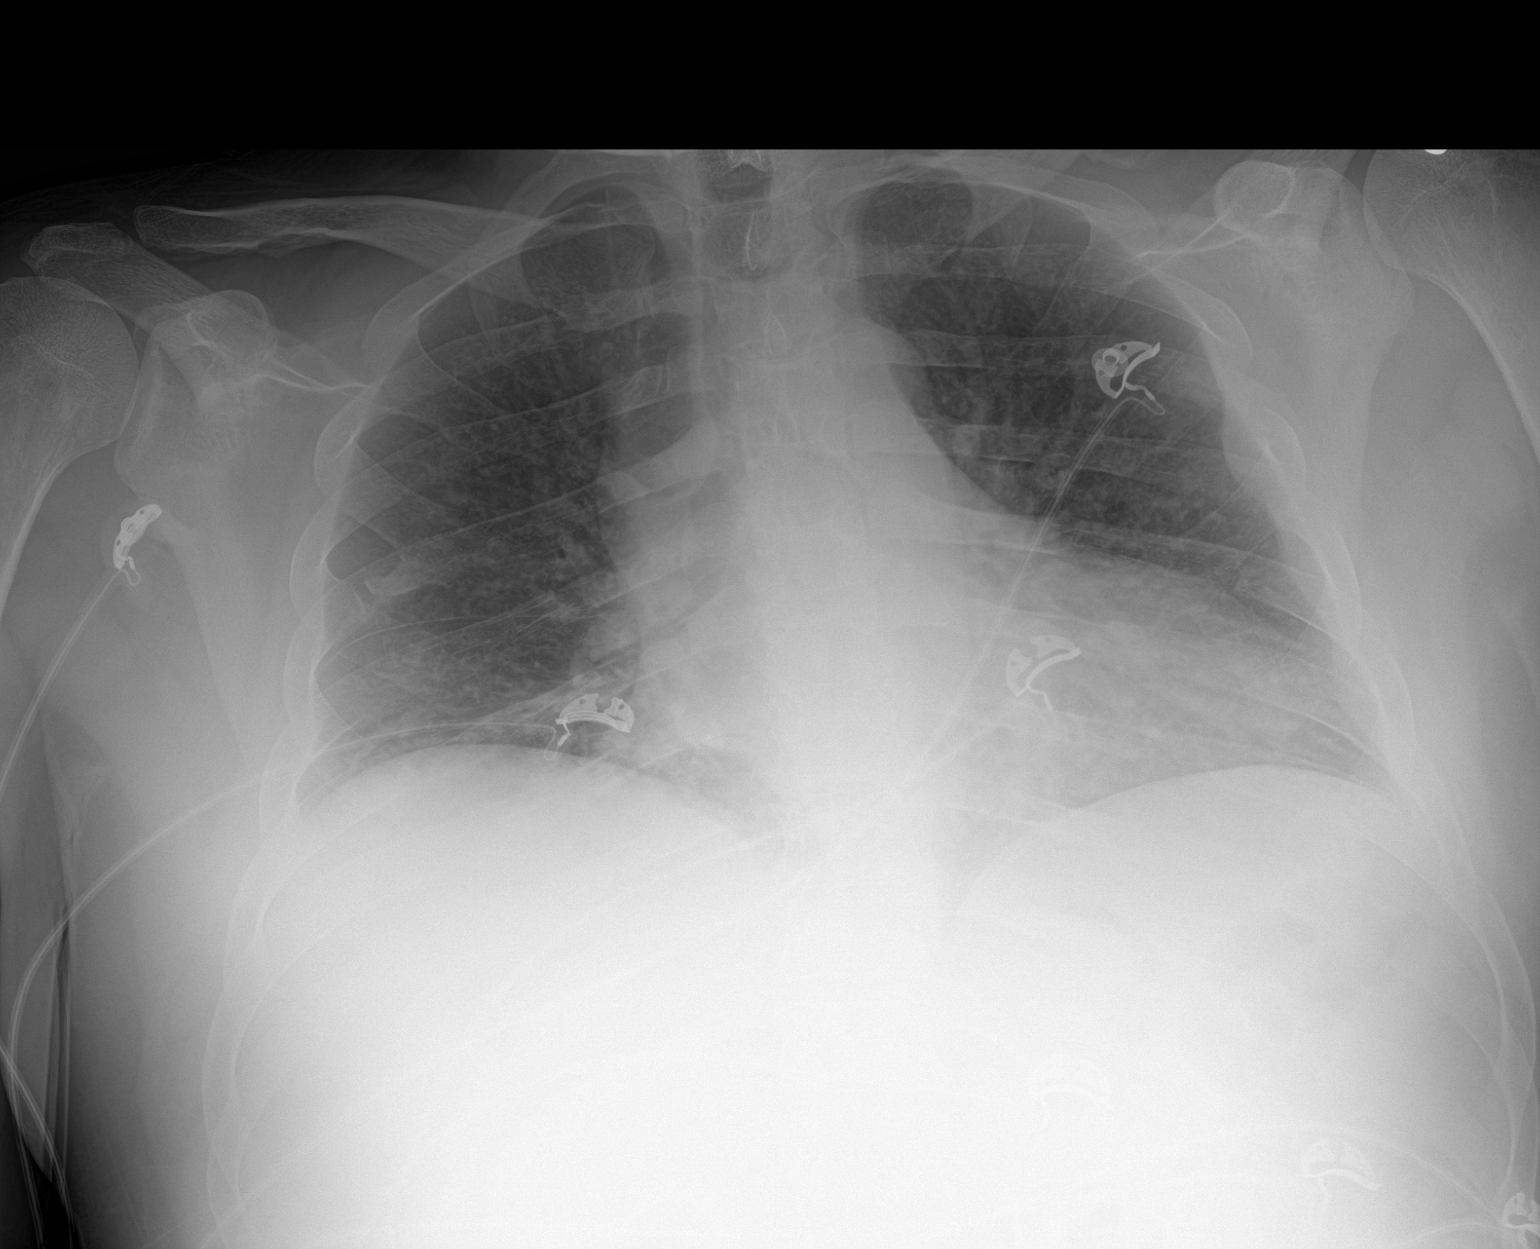

[1 of 1 positions shown; findings below may reference images not displayed]

FINDINGS: Low lung volumes. Cardiac silhouette is enlarged. No pneumothorax.
Fracture of the LEFT clavicle midshaft with override. There appears
to be callus formation. Probable remote rib fractures on the LEFT on
also indeterminate.
IMPRESSION: 1. Age indeterminate LEFT clavicle fracture. Recommend clinical
correlation.
2. Probable remote rib fractures and LEFT lateral chest wall.
3. Low lung volumes and cardiomegaly.

## 2017-01-25 IMAGING — CT CT HEAD W/O CM
2 of 3 series · 14 of 30 positions shown, 17 images · non-contrast
Comparison: CT head 07/04/2006

CLINICAL DATA: Found based on on floor, unresponsive, smells
strongly of alcohol

EXAM:
CT HEAD WITHOUT CONTRAST
CT CERVICAL SPINE WITHOUT CONTRAST
TECHNIQUE: Multidetector CT imaging of the head and cervical spine was
performed following the standard protocol without intravenous
contrast. Multiplanar CT image reconstructions of the cervical spine
were also generated.

[Series 2: head wo · axial · 0.44mm/px · z∈[-116,-67]mm · 2 of 34 slices shown]
[im 12/34  brain]
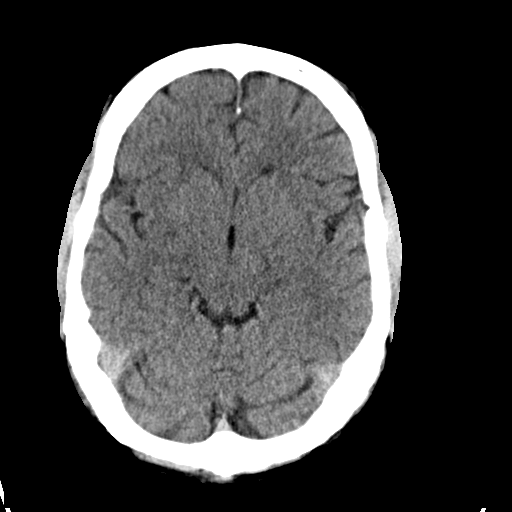
[im 23/34  brain]
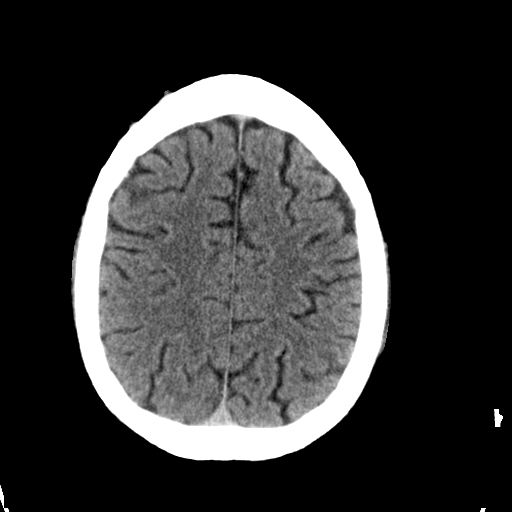

[Series 9: orthogonal axials · axial · 0.18mm/px · z∈[-328,-173]mm · 12 of 101 slices shown, 15 images]
[im 8/101  brain]
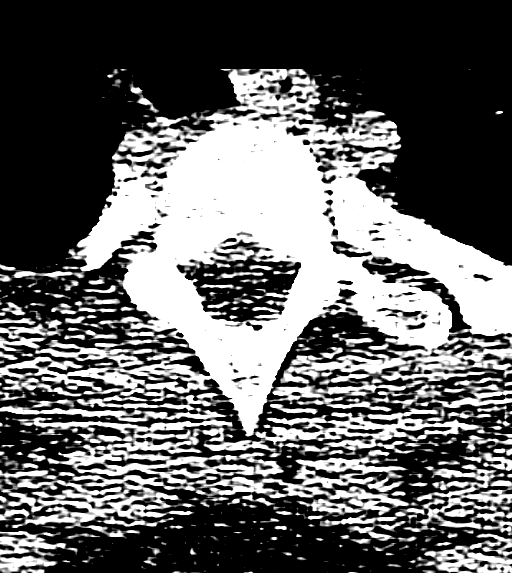
[im 8/101  bone]
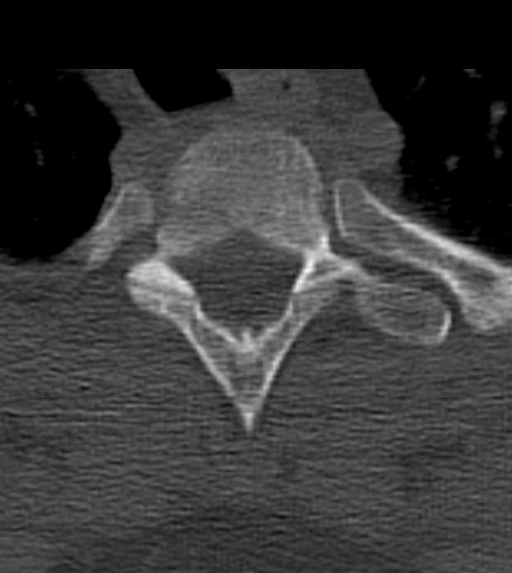
[im 16/101  brain]
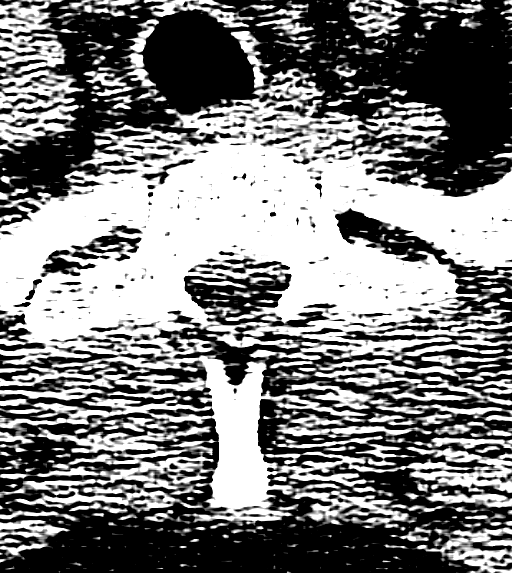
[im 24/101  brain]
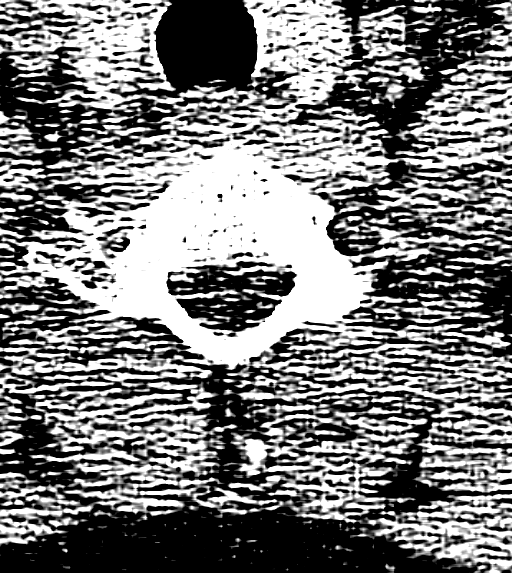
[im 31/101  brain]
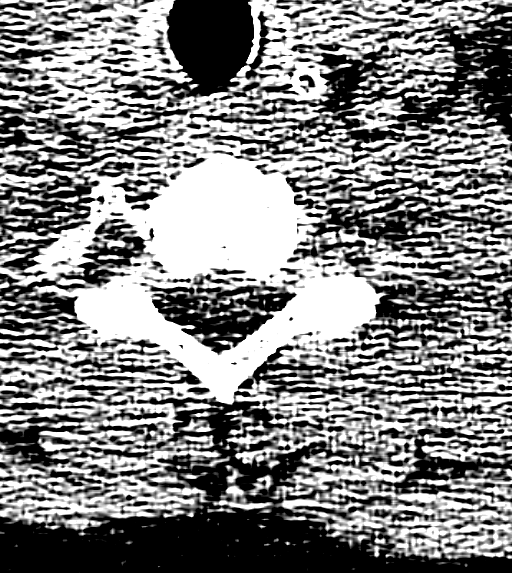
[im 39/101  brain]
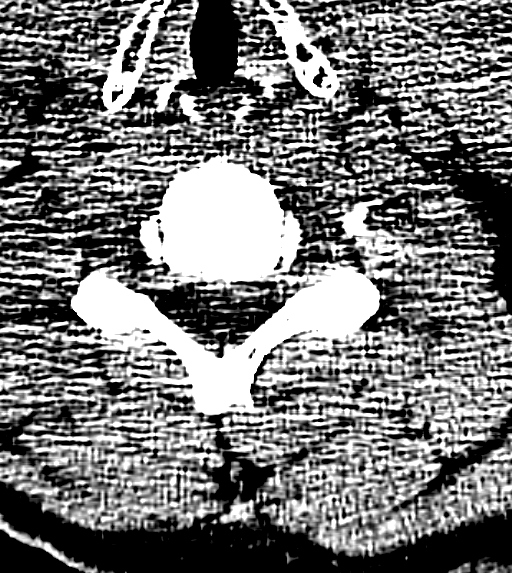
[im 39/101  bone]
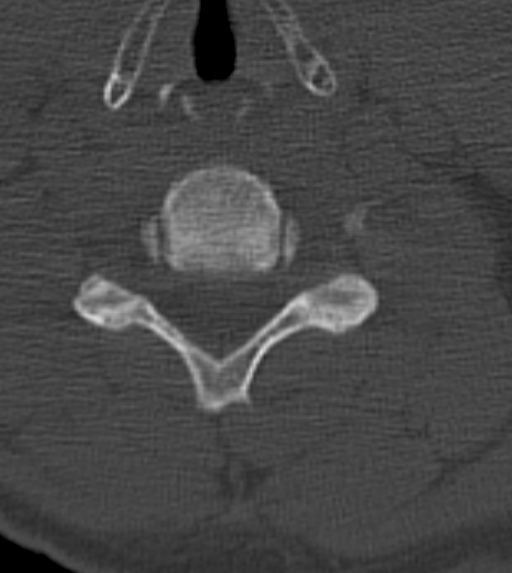
[im 47/101  brain]
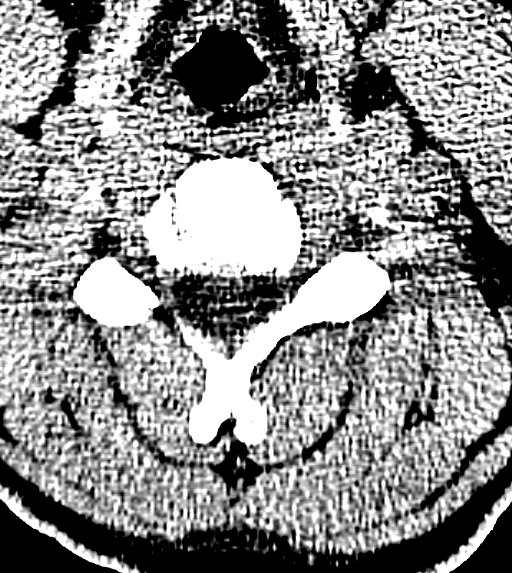
[im 54/101  brain]
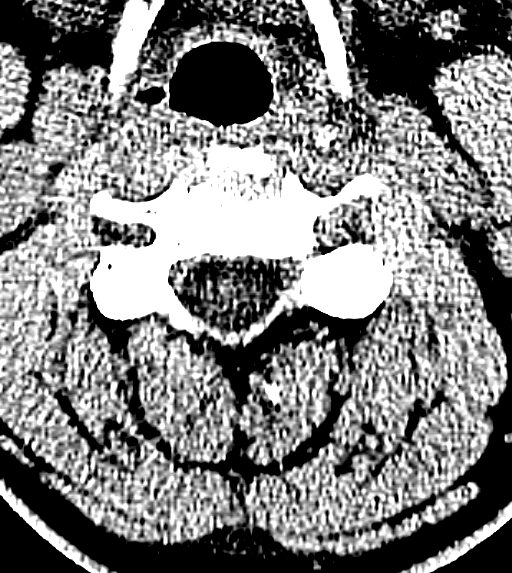
[im 62/101  brain]
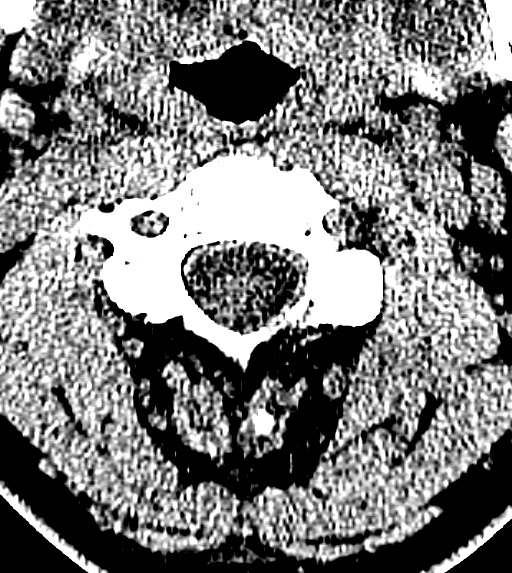
[im 70/101  brain]
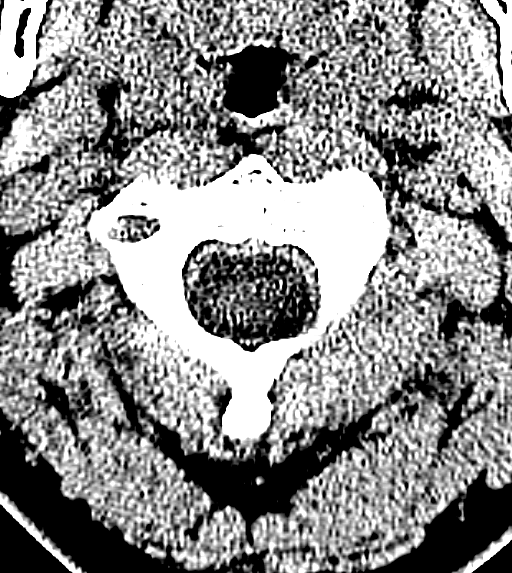
[im 70/101  bone]
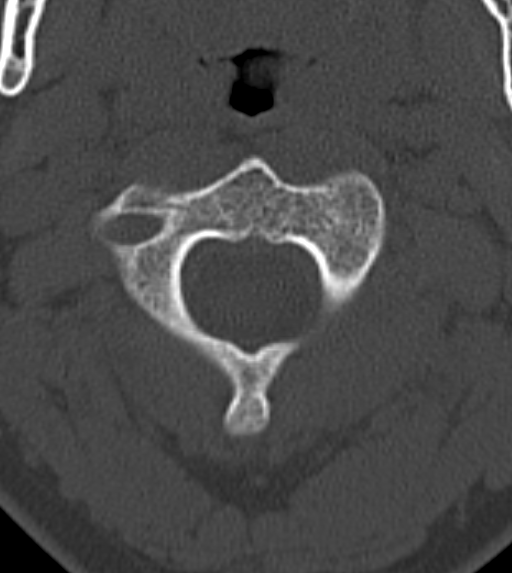
[im 77/101  brain]
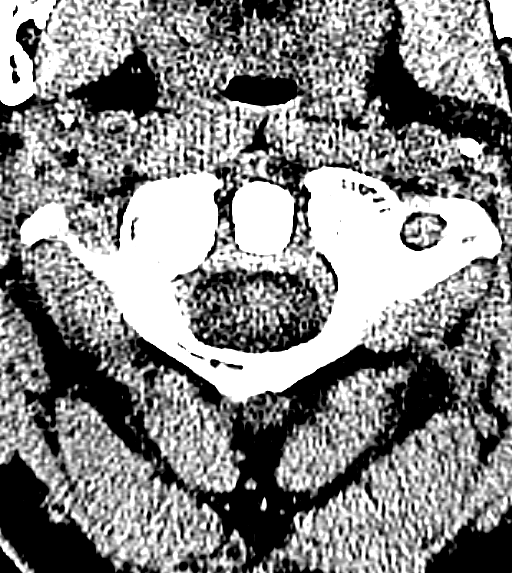
[im 85/101  brain]
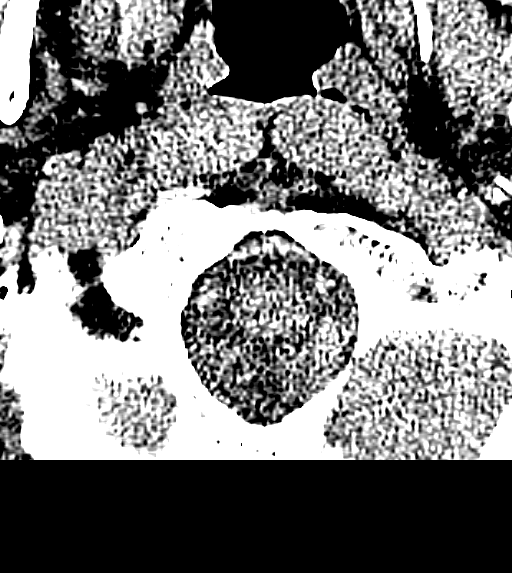
[im 93/101  brain]
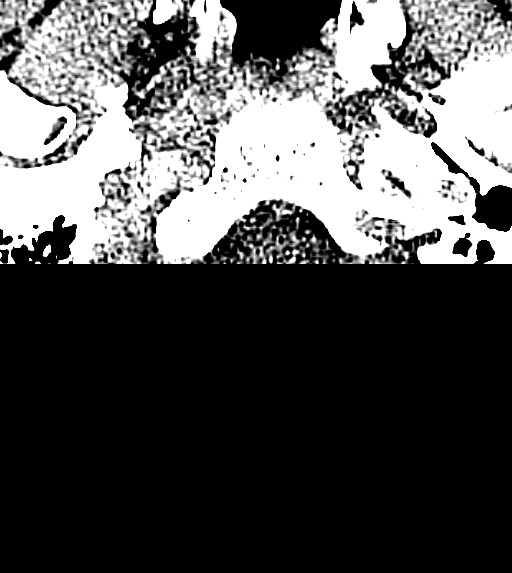

[14 of 30 positions shown; findings below may reference images not displayed]

FINDINGS: CT HEAD FINDINGS

Normal ventricular morphology.

No midline shift or mass effect.

Normal appearance of brain parenchyma.

No intracranial hemorrhage, mass lesion or evidence acute
infarction.

No extra-axial fluid collections.

Sinuses clear and bones unremarkable.

CT CERVICAL SPINE FINDINGS

Displaced fracture middle third LEFT clavicle, subacute/healing.

Visualized skullbase intact.

Prevertebral soft tissues normal thickness.

Vertebral body and disc space heights maintained.

No acute fracture, subluxation or bone destruction.

Lung apices clear.
IMPRESSION: No acute intracranial abnormalities.

No acute cervical spine abnormalities.

Subacute/healing fracture middle third LEFT clavicle.

## 2017-04-09 ENCOUNTER — Emergency Department
Admission: EM | Admit: 2017-04-09 | Discharge: 2017-04-09 | Disposition: A | Discharge status: Home / Self Care | Payer: Self-pay | Attending: Emergency Medicine | Admitting: Emergency Medicine

## 2017-04-09 ENCOUNTER — Encounter: Payer: Self-pay | Admitting: Emergency Medicine

## 2017-04-09 DIAGNOSIS — F1994 Other psychoactive substance use, unspecified with psychoactive substance-induced mood disorder: Secondary | ICD-10-CM

## 2017-04-09 DIAGNOSIS — F10920 Alcohol use, unspecified with intoxication, uncomplicated: Secondary | ICD-10-CM | POA: Insufficient documentation

## 2017-04-09 DIAGNOSIS — R45851 Suicidal ideations: Secondary | ICD-10-CM | POA: Insufficient documentation

## 2017-04-09 DIAGNOSIS — F918 Other conduct disorders: Secondary | ICD-10-CM | POA: Insufficient documentation

## 2017-04-09 DIAGNOSIS — F10929 Alcohol use, unspecified with intoxication, unspecified: Secondary | ICD-10-CM

## 2017-04-09 HISTORY — DX: Alcohol dependence, uncomplicated: F10.20

## 2017-04-09 LAB — CBC WITH DIFFERENTIAL/PLATELET
BASOS PCT: 1 %
Basophils Absolute: 0.1 10*3/uL (ref 0–0.1)
EOS ABS: 0 10*3/uL (ref 0–0.7)
Eosinophils Relative: 0 %
HCT: 42.6 % (ref 40.0–52.0)
Hemoglobin: 14.6 g/dL (ref 13.0–18.0)
Lymphocytes Relative: 19 %
Lymphs Abs: 2.3 10*3/uL (ref 1.0–3.6)
MCH: 30.9 pg (ref 26.0–34.0)
MCHC: 34.2 g/dL (ref 32.0–36.0)
MCV: 90.4 fL (ref 80.0–100.0)
MONOS PCT: 9 %
Monocytes Absolute: 1 10*3/uL (ref 0.2–1.0)
NEUTROS PCT: 71 %
Neutro Abs: 8.3 10*3/uL — ABNORMAL HIGH (ref 1.4–6.5)
Platelets: 345 10*3/uL (ref 150–440)
RBC: 4.71 MIL/uL (ref 4.40–5.90)
RDW: 13.2 % (ref 11.5–14.5)
WBC: 11.7 10*3/uL — AB (ref 3.8–10.6)

## 2017-04-09 LAB — COMPREHENSIVE METABOLIC PANEL
ALK PHOS: 120 U/L (ref 38–126)
ALT: 45 U/L (ref 17–63)
ANION GAP: 16 — AB (ref 5–15)
AST: 71 U/L — ABNORMAL HIGH (ref 15–41)
Albumin: 4.7 g/dL (ref 3.5–5.0)
BILIRUBIN TOTAL: 1.9 mg/dL — AB (ref 0.3–1.2)
BUN: 8 mg/dL (ref 6–20)
CALCIUM: 9 mg/dL (ref 8.9–10.3)
CO2: 22 mmol/L (ref 22–32)
Chloride: 102 mmol/L (ref 101–111)
Creatinine, Ser: 0.52 mg/dL — ABNORMAL LOW (ref 0.61–1.24)
Glucose, Bld: 119 mg/dL — ABNORMAL HIGH (ref 65–99)
Potassium: 3.4 mmol/L — ABNORMAL LOW (ref 3.5–5.1)
SODIUM: 140 mmol/L (ref 135–145)
TOTAL PROTEIN: 8.2 g/dL — AB (ref 6.5–8.1)

## 2017-04-09 LAB — URINE DRUG SCREEN, QUALITATIVE (ARMC ONLY)
Amphetamines, Ur Screen: NOT DETECTED
BARBITURATES, UR SCREEN: NOT DETECTED
BENZODIAZEPINE, UR SCRN: POSITIVE — AB
CANNABINOID 50 NG, UR ~~LOC~~: NOT DETECTED
COCAINE METABOLITE, UR ~~LOC~~: NOT DETECTED
MDMA (Ecstasy)Ur Screen: NOT DETECTED
METHADONE SCREEN, URINE: NOT DETECTED
Opiate, Ur Screen: NOT DETECTED
Phencyclidine (PCP) Ur S: NOT DETECTED
TRICYCLIC, UR SCREEN: NOT DETECTED

## 2017-04-09 LAB — ETHANOL: ALCOHOL ETHYL (B): 434 mg/dL — AB (ref <5)

## 2017-04-09 MED ORDER — LORAZEPAM 2 MG/ML IJ SOLN
2.0000 mg | Freq: Once | INTRAMUSCULAR | Status: AC
  Administered 2017-04-09: 2 mg via INTRAMUSCULAR

## 2017-04-09 MED ORDER — ZIPRASIDONE MESYLATE 20 MG IM SOLR
INTRAMUSCULAR | Status: AC
  Administered 2017-04-09: 20 mg via INTRAMUSCULAR
  Filled 2017-04-09: qty 20

## 2017-04-09 MED ORDER — LORAZEPAM 2 MG/ML IJ SOLN
INTRAMUSCULAR | Status: AC
  Administered 2017-04-09: 2 mg via INTRAMUSCULAR
  Filled 2017-04-09: qty 1

## 2017-04-09 MED ORDER — ZIPRASIDONE MESYLATE 20 MG IM SOLR
20.0000 mg | Freq: Once | INTRAMUSCULAR | Status: AC
  Administered 2017-04-09: 20 mg via INTRAMUSCULAR

## 2017-04-09 NOTE — ED Provider Notes (Signed)
-----------------------------------------   8:22 PM on 04/09/2017 -----------------------------------------   Blood pressure 133/80, pulse 95, temperature 98.5 F (36.9 C), temperature source Oral, resp. rate 20, weight 98.9 kg (218 lb), SpO2 95 %.  The patient had no acute events since last update.  Calm and cooperative at this time.  Patient is awake and alert. Able to ambulate with a normal gait. Clinically sober. Says that he will be getting a ride home with his girlfriend. He is denying any suicidal or homicidal ideation. He had been evaluated by Dr. Weber Cooks and psychiatry earlier do not believe the patient met criteria for inpatient stay.     Billy Pyo, MD 04/09/17 [phone]

## 2017-04-09 NOTE — Consult Note (Signed)
[place] [facility] Consult   Reason for Consult:  Consult for 31 year old man who presented to the emergency room under IVC Referring Physician:  [place] Patient Identification: Billy Frost MRN:  [id number] Principal Diagnosis: Substance induced mood disorder [facility]) Diagnosis:   Patient Active Problem List   Diagnosis Date Noted  . Alcohol intoxication ([place]) [F10.929] 04/09/2017  . Substance induced mood disorder [facility]) [F19.94] 04/09/2017    Total Time spent with patient: 1 hour  Subjective:   Billy Frost is a 31 y.o. male patient admitted with "I want to go home".  HPI:  Patient brought to the emergency room under commitment filed by his family. Commitment paper alleges that he had been aggressive and made suicidal statements. Patient was grossly intoxicated on arrival with a blood alcohol level measured about 430. When I saw the patient he was still obviously intoxicated. He was wondering around his room with his pants off. He had urinated on the floor. Very confused. Slurred speech. Nevertheless he proudly told me "I am not drunk". Patient is not the most clear historian but was able to tell me that he had recently been released from prison just about 4 or 5 days ago. He had immediately gone back to heavy abuse of alcohol and possibly other drugs. He tells me that he drinks "not very much". Again not very reliable. Couldn't tell me how much she had today. Family said that he had made suicidal statements. Patient completely denied it.  Social history: Just released from prison. 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 I doubt very much that he has actually gotten back into regular employment yet. Sounds like he is living in an apartment.  Medical history: No known significant medical problems outside of the alcohol abuse and a past history of fractured collar bone.  Substance abuse history: Long-standing alcohol abuse. Multiple prior visits to the emergency room before  his prison stay all for alcohol abuse or injuries related to alcohol abuse. Patient tells me he does not think he is ever had a seizure or delirium tremens. He does tell me that he has abused several other drugs including cocaine and opiates in the past. He is uncertain whether he's been using any of that now. When discussing his past diagnosis of ADHD and having been prescribed Adderall he tells me "man I really love that stuff, I get super high when I take it".  Past Psychiatric History: Unclear if he is ever really engaged in substance abuse treatment. Patient has no known history of involvement in substance abuse treatment or attempts to get sober in the past. Denies any past suicide attempts. He had been treated for ADHD in the past. As I noted above today the patient told me that he loved taking stimulants including Adderall because he would abuse them and get high with him.  Risk to Self: Is patient at risk for suicide?: No Risk to Others:   Prior Inpatient Therapy:   Prior Outpatient Therapy:    Past Medical History:  Past Medical History:  Diagnosis Date  . ADHD (attention deficit hyperactivity disorder)   . Alcoholism ([place])    History reviewed. No pertinent surgical history. Family History:  Family History  Problem Relation Age of Onset  . Heart disease Father    Family Psychiatric  History: Denies knowing of any family history Social History:  History  Alcohol Use  . Yes     History  Drug Use  . Types: Cocaine, Methamphetamines  Comment: heroin    Social History   Social History  . Marital status: Single    Spouse name: N/A  . Number of children: N/A  . Years of education: N/A   Social History Main Topics  . Smoking status: Never Smoker  . Smokeless tobacco: Never Used  . Alcohol use Yes  . Drug use: Yes    Types: Cocaine, Methamphetamines     Comment: heroin  . Sexual activity: Not Asked   Other Topics Concern  . None   Social History Narrative  .  None   Additional Social History:    Allergies:  No Known Allergies  Labs:  Results for orders placed or performed during the hospital encounter of 04/09/17 (from the past 48 hour(s))  Comprehensive metabolic panel     Status: Abnormal   Collection Time: 04/09/17 12:29 PM  Result Value Ref Range   Sodium 140 135 - 145 mmol/L   Potassium 3.4 (L) 3.5 - 5.1 mmol/L   Chloride 102 101 - 111 mmol/L   CO2 22 22 - 32 mmol/L   Glucose, Bld 119 (H) 65 - 99 mg/dL   BUN 8 6 - 20 mg/dL   Creatinine, Ser 0.52 (L) 0.61 - 1.24 mg/dL   Calcium 9.0 8.9 - 10.3 mg/dL   Total Protein 8.2 (H) 6.5 - 8.1 g/dL   Albumin 4.7 3.5 - 5.0 g/dL   AST 71 (H) 15 - 41 U/L   ALT 45 17 - 63 U/L   Alkaline Phosphatase 120 38 - 126 U/L   Total Bilirubin 1.9 (H) 0.3 - 1.2 mg/dL   GFR calc non Af Amer >60 >60 mL/min   GFR calc Af Amer >60 >60 mL/min    Comment: (NOTE) The eGFR has been calculated using the CKD EPI equation. This calculation has not been validated in all clinical situations. eGFR's persistently <60 mL/min signify possible Chronic Kidney Disease.    Anion gap 16 (H) 5 - 15  Ethanol     Status: Abnormal   Collection Time: 04/09/17 12:29 PM  Result Value Ref Range   Alcohol, Ethyl (B) 434 (HH) <5 mg/dL    Comment: CRITICAL RESULT CALLED TO, READ BACK BY AND VERIFIED WITH TINA CARR 04/09/17 1317 KLW        LOWEST DETECTABLE LIMIT FOR SERUM ALCOHOL IS 5 mg/dL FOR MEDICAL PURPOSES ONLY   CBC with Diff     Status: Abnormal   Collection Time: 04/09/17 12:29 PM  Result Value Ref Range   WBC 11.7 (H) 3.8 - 10.6 K/uL   RBC 4.71 4.40 - 5.90 MIL/uL   Hemoglobin 14.6 13.0 - 18.0 g/dL   HCT 42.6 40.0 - 52.0 %   MCV 90.4 80.0 - 100.0 fL   MCH 30.9 26.0 - 34.0 pg   MCHC 34.2 32.0 - 36.0 g/dL   RDW 13.2 11.5 - 14.5 %   Platelets 345 150 - 440 K/uL   Neutrophils Relative % 71 %   Neutro Abs 8.3 (H) 1.4 - 6.5 K/uL   Lymphocytes Relative 19 %   Lymphs Abs 2.3 1.0 - 3.6 K/uL   Monocytes Relative 9 %    Monocytes Absolute 1.0 0.2 - 1.0 K/uL   Eosinophils Relative 0 %   Eosinophils Absolute 0.0 0 - 0.7 K/uL   Basophils Relative 1 %   Basophils Absolute 0.1 0 - 0.1 K/uL  Urine Drug Screen, Qualitative     Status: Abnormal   Collection Time: 04/09/17  7:00 PM  Result Value Ref Range   Tricyclic, Ur Screen NONE DETECTED NONE DETECTED   Amphetamines, Ur Screen NONE DETECTED NONE DETECTED   MDMA (Ecstasy)Ur Screen NONE DETECTED NONE DETECTED   Cocaine Metabolite,Ur [place] NONE DETECTED NONE DETECTED   Opiate, Ur Screen NONE DETECTED NONE DETECTED   Phencyclidine (PCP) Ur S NONE DETECTED NONE DETECTED   Cannabinoid 50 Ng, Ur [place] NONE DETECTED NONE DETECTED   Barbiturates, Ur Screen NONE DETECTED NONE DETECTED   Benzodiazepine, Ur Scrn POSITIVE (A) NONE DETECTED   Methadone Scn, Ur NONE DETECTED NONE DETECTED    Comment: (NOTE) 683  Tricyclics, urine               Cutoff 1000 ng/mL 200  Amphetamines, urine             Cutoff 1000 ng/mL 300  MDMA (Ecstasy), urine           Cutoff 500 ng/mL 400  Cocaine Metabolite, urine       Cutoff 300 ng/mL 500  Opiate, urine                   Cutoff 300 ng/mL 600  Phencyclidine (PCP), urine      Cutoff 25 ng/mL 700  Cannabinoid, urine              Cutoff 50 ng/mL 800  Barbiturates, urine             Cutoff 200 ng/mL 900  Benzodiazepine, urine           Cutoff 200 ng/mL 1000 Methadone, urine                Cutoff 300 ng/mL 1100 1200 The urine drug screen provides only a preliminary, unconfirmed 1300 analytical test result and should not be used for non-medical 1400 purposes. Clinical consideration and professional judgment should 1500 be applied to any positive drug screen result due to possible 1600 interfering substances. A more specific alternate chemical method 1700 must be used in order to obtain a confirmed analytical result.  1800 Gas chromato graphy / mass spectrometry (GC/MS) is the preferred 1900 confirmatory method.     No current  facility-administered medications for this encounter.    Current Outpatient Prescriptions  Medication Sig Dispense Refill  . amphetamine-dextroamphetamine (ADDERALL) 30 MG tablet Take 60 mg by mouth 2 (two) times daily as needed (for ADHD).      Musculoskeletal: Strength & Muscle Tone: within normal limits Gait & Station: unsteady, ataxic Patient leans: N/A  Psychiatric Specialty Exam: Physical Exam  Nursing note and vitals reviewed. Constitutional: He appears well-developed and well-nourished.  HENT:  Head: Normocephalic and atraumatic.  Eyes: Pupils are equal, round, and reactive to light. Conjunctivae are normal.  Neck: Normal range of motion.  Cardiovascular: Regular rhythm and normal heart sounds.   Respiratory: Effort normal.  GI: Soft.  Musculoskeletal: Normal range of motion.  Neurological: He is alert. Coordination abnormal.  Skin: Skin is warm and dry.  Psychiatric: His affect is blunt. His speech is delayed, tangential and slurred. He is agitated. He is not aggressive. Cognition and memory are impaired. He expresses impulsivity and inappropriate judgment. He expresses no homicidal and no suicidal ideation. He exhibits abnormal recent memory and abnormal remote memory. He is inattentive.    Review of Systems  Constitutional: Negative.   HENT: Negative.   Eyes: Negative.   Respiratory: Negative.   Cardiovascular: Negative.   Gastrointestinal: Negative.   Musculoskeletal: Negative.   Skin: Negative.  Neurological: Negative.   Psychiatric/Behavioral: Negative for depression, hallucinations, memory loss, substance abuse and suicidal ideas. The patient is not nervous/anxious and does not have insomnia.     Blood pressure 133/80, pulse 95, temperature 98.5 F (36.9 C), temperature source Oral, resp. rate 20, weight 98.9 kg (218 lb), SpO2 95 %.Body mass index is 28.76 kg/m.  General Appearance: Disheveled  Eye Contact:  Minimal  Speech:  Garbled and Slurred  Volume:   Decreased  Mood:  Euthymic  Affect:  Mostly confused  Thought Process:  Disorganized  Orientation:  Negative  Thought Content:  Illogical, Rumination and Tangential  Suicidal Thoughts:  No  Homicidal Thoughts:  No  Memory:  Immediate;   Poor Recent;   Poor Remote;   Fair  Judgement:  Poor  Insight:  Lacking  Psychomotor Activity:  Restlessness  Concentration:  Concentration: Poor  Recall:  Poor  Fund of Knowledge:  Poor  Language:  Poor  Akathisia:  No  Handed:  Right  AIMS (if indicated):     Assets:  Physical Health Social Support  ADL's:  Impaired  Cognition:  Impaired,  Mild and Moderate  Sleep:        Treatment Plan Summary: Medication management and Plan 31 year old man presents to the hospital intoxicated. Allegations were made about suicidal statements. I found the patient to mostly be in an upbeat mood. He scoffed quite loudly when I ask him about suicidality. He was frequently boastful when he wasn't completely confused. I doubt very much that he will need psychiatric inpatient treatment. However at this point he is still far too intoxicated to be released from the emergency room. Case reviewed with the ER physician. Recommend that he be allowed to sober up. He can be provided with when necessary medicines if anything is needed but right now I would not add any benzodiazepines on top of his alcohol level. We can re-evaluate once he is sober.  Disposition: Supportive therapy provided about ongoing stressors.  Alethia Berthold, MD 04/09/2017 8:50 PM

## 2017-04-09 NOTE — ED Notes (Signed)
Pt's mother called back and agreed to come and pick up the patient and confirmed that he can stay with her. Pt also wished to speak to mom and was allowed to do so. Mom states she will be here in approximately 30 minutes to pick up the patient.

## 2017-04-09 NOTE — ED Notes (Addendum)
Attempted to call Ammiel Guiney, pt's mother at [phone], to inquire about where the patient will go once he is discharged; no one answered the phone, voicemail left.  Called Draiden Mirsky, pt's father at [phone], and he stated that the patient does not live with him anymore; when asked where the patient will go when he gets discharged, Mr Antone Summons stated, "I don't know, and don't care"

## 2017-04-09 NOTE — ED Triage Notes (Signed)
Pt to ed with IVC papers.  Per mother pt was combative, drinking heavily and reported that he was going to kills himself through drinking too much.  Pt recently released from jail for DUI.  Pt unable to follow commands at this time, urinating in the floor.  Pt also naked upon arrival to ED.

## 2017-04-09 NOTE — ED Notes (Signed)
Pt walked to door and this tech redirected pt back to bed.

## 2017-04-09 NOTE — ED Provider Notes (Signed)
[facility] Emergency Department Provider Note   ____________________________________________    I have reviewed the triage vital signs and the nursing notes.   HISTORY  Chief Complaint Aggressive Behavior     HPI Billy Frost is a 31 y.o. male With a history of alcohol abuse presents today with altered mental status and aggressive behavior. He presents in handcuffs with the sheriff. The patient is not offering any history. Sheriff reports the patient has a long history of alcohol abuse and his mother called today because she was worried aboutthe amount of alcohol he was consuming.reportedly he told his mother he wanted to "drink himself to death".   Past Medical History:  Diagnosis Date  . ADHD (attention deficit hyperactivity disorder)   . Alcoholism ([facility])     There are no active problems to display for this patient.   History reviewed. No pertinent surgical history.  Prior to Admission medications   Medication Sig Start Date End Date Taking? Authorizing Provider  amphetamine-dextroamphetamine (ADDERALL) 30 MG tablet Take 60 mg by mouth 2 (two) times daily as needed (for ADHD).    [provider]     Allergies Patient has no known allergies.  Family History  Problem Relation Age of Onset  . Heart disease Father     Social History Social History  Substance Use Topics  . Smoking status: Never Smoker  . Smokeless tobacco: Never Used  . Alcohol use Yes    Level V caveat: Unable to obtain Review of Systems as patient is altered and not cooperative     ____________________________________________   PHYSICAL EXAM:  VITAL SIGNS: ED Triage Vitals [04/09/17 1250]  Enc Vitals Group     BP 133/80     Pulse Rate 95     Resp 20     Temp 98.5 F (36.9 C)     Temp Source Oral     SpO2 95 %     Weight 98.9 kg (218 lb)     Height      Head Circumference      Peak Flow      Pain Score      Pain Loc      Pain Edu?        Excl. in GC?     Constitutional: Alert. No acute distress. Eyes: Conjunctivae are normal.  Head: Atraumatic. Nose: No congestion/rhinnorhea. Mouth/Throat: Mucous membranes are moist.   Neck:  Painless ROM Cardiovascular: Normal rate, regular rhythm.   Good peripheral circulation. Respiratory: Normal respiratory effort.  No retractions.  Gastrointestinal: Soft and nontender. No distention.   Genitourinary: deferred Musculoskeletal  Warm and well perfused Neurologic:   No gross focal neurologic deficits are appreciated.  Skin:  Skin is warm, dry and intact. No rash noted.   ____________________________________________   LABS (all labs ordered are listed, but only abnormal results are displayed)  Labs Reviewed  COMPREHENSIVE METABOLIC PANEL - Abnormal; Notable for the following:       Result Value   Potassium 3.4 (*)    Glucose, Bld 119 (*)    Creatinine, Ser 0.52 (*)    Total Protein 8.2 (*)    AST 71 (*)    Total Bilirubin 1.9 (*)    Anion gap 16 (*)    All other components within normal limits  ETHANOL - Abnormal; Notable for the following:    Alcohol, Ethyl (B) 434 (*)    All other components within normal limits  CBC WITH DIFFERENTIAL/PLATELET -  Abnormal; Notable for the following:    WBC 11.7 (*)    Neutro Abs 8.3 (*)    All other components within normal limits  URINE DRUG SCREEN, QUALITATIVE ([facility] ONLY)   ____________________________________________  EKG  None ____________________________________________  RADIOLOGY  None ____________________________________________   PROCEDURES  Procedure(s) performed: No    Critical Care performed: No ____________________________________________   INITIAL IMPRESSION / ASSESSMENT AND PLAN / ED COURSE  Pertinent labs & imaging results that were available during my care of the patient were reviewed by me and considered in my medical decision making (see chart for details).  Patient appears to be extremely  intoxicated on clinical exam. We had the officer remove handcuffs and he immediately became agitated necessitating Geodon 20 mg IM.  We'll place the patient under involuntary commitment given reports of suicidal ideationand consult psychiatry  ----------------------------------------- 2:37 PM on 04/09/2017 -----------------------------------------  Patient was walking around room naked urinating in the corners, we were concerned that he might fall, I ordered 2 mg IM Ativan      ____________________________________________   FINAL CLINICAL IMPRESSION(S) / ED DIAGNOSES  Final diagnoses:  Alcoholic intoxication without complication (HCC)  Suicidal ideation      NEW MEDICATIONS STARTED DURING THIS VISIT:  New Prescriptions   No medications on file     Note:  This document was prepared using Dragon voice recognition software and may include unintentional dictation errors.    Jene Every, MD 04/09/17 1438

## 2017-04-09 NOTE — ED Notes (Signed)

## 2017-04-09 NOTE — ED Notes (Signed)
Pt dressed out in blue scrubs; discharge instructions reviewed with patient.

## 2018-02-13 ENCOUNTER — Ambulatory Visit: Payer: Self-pay | Admitting: Podiatry

## 2020-05-28 ENCOUNTER — Ambulatory Visit
Admission: EM | Admit: 2020-05-28 | Discharge: 2020-05-28 | Disposition: A | Discharge status: Home / Self Care | Payer: 59 | Attending: Emergency Medicine | Admitting: Emergency Medicine

## 2020-05-28 ENCOUNTER — Other Ambulatory Visit: Payer: Self-pay

## 2020-05-28 ENCOUNTER — Encounter: Payer: Self-pay | Admitting: Emergency Medicine

## 2020-05-28 DIAGNOSIS — Z0000 Encounter for general adult medical examination without abnormal findings: Secondary | ICD-10-CM

## 2020-05-28 NOTE — ED Provider Notes (Signed)
HPI  SUBJECTIVE:  Billy Frost is a 34 y.o. male who presents requesting a work note for the past few days.  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  He currently has no physical complaints.  He has no past medical history.  PMD: [facility]   Past Medical History:  Diagnosis Date  . ADHD (attention deficit hyperactivity disorder)   . Alcoholism ([facility])     History reviewed. No pertinent surgical history.  Family History  Problem Relation Age of Onset  . Heart disease Father     Social History   Tobacco Use  . Smoking status: Current Some Day Smoker  . Smokeless tobacco: Current User    Types: Snuff  Substance Use Topics  . Alcohol use: Not Currently  . Drug use: Not Currently    Types: Cocaine, Methamphetamines    Comment: heroin    No current facility-administered medications for this encounter.  Current Outpatient Medications:  .  amphetamine-dextroamphetamine (ADDERALL) 30 MG tablet, Take 60 mg by mouth 2 (two) times daily as needed (for ADHD)., Disp: , Rfl:   No Known Allergies   ROS  As noted in HPI.   Physical Exam  BP 98/70 (BP Location: Right Arm)   Pulse 90   Temp 98 F (36.7 C) (Oral)   Resp 18   Ht 6\' 1"  (1.854 m)   Wt 98.9 kg   SpO2 100%   BMI 28.77 kg/m   Constitutional: Well developed, well nourished, no acute distress Eyes:  EOMI, conjunctiva normal bilaterally HENT: Normocephalic, atraumatic,mucus membranes moist Respiratory: Normal inspiratory effort Cardiovascular: Normal rate GI: nondistended skin: No rash, skin intact Musculoskeletal: no deformities Neurologic: Alert & oriented x 3, no focal neuro deficits Psychiatric: Speech and behavior appropriate   ED Course   Medications - No data to display  No orders of the defined types were placed in this encounter.   No results found for this or any previous visit (from the past 24 hour(s)). No results found.  ED Clinical  Impression  1. Normal exam      ED Assessment/Plan  Patient has no physical complaints today.  Discussed with patient that I was unable to write a retroactive work note, but that I am able to write one for day stating that he was here.  No orders of the defined types were placed in this encounter.   *This clinic note was created using Dragon dictation software. Therefore, there may be occasional mistakes despite careful proofreading.   ?    , MD 05/28/20 [id number]

## 2020-05-28 NOTE — ED Triage Notes (Signed)
Patient states he needs a doctors note to return to work. He has no complaints today.

## 2023-03-09 ENCOUNTER — Encounter: Payer: Self-pay | Admitting: Intensive Care

## 2023-03-09 ENCOUNTER — Other Ambulatory Visit: Payer: Self-pay

## 2023-03-09 ENCOUNTER — Emergency Department
Admission: EM | Admit: 2023-03-09 | Discharge: 2023-03-09 | Disposition: A | Discharge status: Home / Self Care | Payer: 59 | Source: Home / Self Care | Attending: Student in an Organized Health Care Education/Training Program | Admitting: Student in an Organized Health Care Education/Training Program

## 2023-03-09 DIAGNOSIS — F1010 Alcohol abuse, uncomplicated: Secondary | ICD-10-CM

## 2023-03-09 DIAGNOSIS — E871 Hypo-osmolality and hyponatremia: Secondary | ICD-10-CM | POA: Insufficient documentation

## 2023-03-09 DIAGNOSIS — F10129 Alcohol abuse with intoxication, unspecified: Secondary | ICD-10-CM | POA: Insufficient documentation

## 2023-03-09 DIAGNOSIS — Y908 Blood alcohol level of 240 mg/100 ml or more: Secondary | ICD-10-CM | POA: Insufficient documentation

## 2023-03-09 LAB — CBC WITH DIFFERENTIAL/PLATELET
Abs Immature Granulocytes: 0.05 10*3/uL (ref 0.00–0.07)
Basophils Absolute: 0 10*3/uL (ref 0.0–0.1)
Basophils Relative: 0 %
Eosinophils Absolute: 0 10*3/uL (ref 0.0–0.5)
Eosinophils Relative: 0 %
HCT: 46.3 % (ref 39.0–52.0)
Hemoglobin: 16.7 g/dL (ref 13.0–17.0)
Immature Granulocytes: 0 %
Lymphocytes Relative: 12 %
Lymphs Abs: 1.6 10*3/uL (ref 0.7–4.0)
MCH: 31.4 pg (ref 26.0–34.0)
MCHC: 36.1 g/dL — ABNORMAL HIGH (ref 30.0–36.0)
MCV: 87 fL (ref 80.0–100.0)
Monocytes Absolute: 0.6 10*3/uL (ref 0.1–1.0)
Monocytes Relative: 5 %
Neutro Abs: 11.1 10*3/uL — ABNORMAL HIGH (ref 1.7–7.7)
Neutrophils Relative %: 83 %
Platelets: 328 10*3/uL (ref 150–400)
RBC: 5.32 MIL/uL (ref 4.22–5.81)
RDW: 11.5 % (ref 11.5–15.5)
Smear Review: NORMAL
WBC: 13 10*3/uL — ABNORMAL HIGH (ref 4.0–10.5)
nRBC: 0 % (ref 0.0–0.2)

## 2023-03-09 LAB — BASIC METABOLIC PANEL
Anion gap: 17 — ABNORMAL HIGH (ref 5–15)
BUN: 8 mg/dL (ref 6–20)
CO2: 20 mmol/L — ABNORMAL LOW (ref 22–32)
Calcium: 8.3 mg/dL — ABNORMAL LOW (ref 8.9–10.3)
Chloride: 89 mmol/L — ABNORMAL LOW (ref 98–111)
Creatinine, Ser: 0.65 mg/dL (ref 0.61–1.24)
GFR, Estimated: >60 mL/min (ref >60)
Glucose, Bld: 143 mg/dL — ABNORMAL HIGH (ref 70–99)
Potassium: 3.7 mmol/L (ref 3.5–5.1)
Sodium: 126 mmol/L — ABNORMAL LOW (ref 135–145)

## 2023-03-09 LAB — ETHANOL: Alcohol, Ethyl (B): 327 mg/dL (ref <10)

## 2023-03-09 MED ORDER — SODIUM CHLORIDE 0.9 % IV BOLUS
1000.0000 mL | Freq: Once | INTRAVENOUS | Status: AC
  Administered 2023-03-09: 1000 mL via INTRAVENOUS

## 2023-03-09 NOTE — ED Triage Notes (Signed)
Patient arrived by EMS after being pulled by PD this AM for intoxication. Patient has unsteady gait and repeatedly asking to leave.

## 2023-03-09 NOTE — ED Notes (Signed)
MD Roxan Hockey made aware multiple times patients dad is requesting to leave with patient and patients is requesting to leave.

## 2023-03-09 NOTE — ED Notes (Signed)
Family updated, patients dad, as to patient's status.

## 2023-03-09 NOTE — ED Notes (Signed)
Patient's dad at bedside

## 2023-03-09 NOTE — ED Notes (Signed)
Pt's dad at bedside.

## 2023-03-09 NOTE — ED Notes (Signed)
Billy Frost, NT at bedside

## 2023-03-09 NOTE — ED Notes (Signed)
Patient ambulated out with dad

## 2023-03-09 NOTE — ED Provider Notes (Signed)
[facility] Provider Note    Event Date/Time   First MD Initiated Contact with Patient 03/09/23 1103     (approximate)   History   Alcohol Intoxication   HPI  Billy Frost is a 37 y.o. male presents to the ER via EMS after the patient was pulled over for DUI.  Patient does admit to drinking alcohol this morning.  There is no report of accident or collation.  Patient denying any pain or discomfort.     Physical Exam   Triage Vital Signs: ED Triage Vitals [03/09/23 1103]  Encounter Vitals Group     BP (!) 136/101     Systolic BP Percentile      Diastolic BP Percentile      Pulse Rate 94     Resp 18     Temp 98 F (36.7 C)     Temp Source Oral     SpO2 100 %     Weight      Height      Head Circumference      Peak Flow      Pain Score      Pain Loc      Pain Education      Exclude from Growth Chart     Most recent vital signs: Vitals:   03/09/23 1103  BP: (!) 136/101  Pulse: 94  Resp: 18  Temp: 98 F (36.7 C)  SpO2: 100%     Constitutional: Alert, heavily intoxicated Eyes: Conjunctivae are normal.  Head: Atraumatic. Nose: No congestion/rhinnorhea. Mouth/Throat: Mucous membranes are moist.   Neck: Painless ROM.  Cardiovascular:   Good peripheral circulation. Respiratory: Normal respiratory effort.  No retractions.  Gastrointestinal: Soft and nontender.  Musculoskeletal:  no deformity Neurologic:  MAE spontaneously. No gross focal neurologic deficits are appreciated.  Skin:  Skin is warm, dry and intact. No rash noted. Psychiatric: Mood and affect are normal. Speech and behavior are normal.    ED Results / Procedures / Treatments   Labs (all labs ordered are listed, but only abnormal results are displayed) Labs Reviewed  CBC WITH DIFFERENTIAL/PLATELET  BASIC METABOLIC PANEL  ETHANOL     EKG     RADIOLOGY    PROCEDURES:  Critical Care performed:   Procedures   MEDICATIONS ORDERED IN  ED: Medications - No data to display   IMPRESSION / MDM / ASSESSMENT AND PLAN / ED COURSE  I reviewed the triage vital signs and the nursing notes.                              Differential diagnosis includes, but is not limited to, EtOH abuse, polysubstance abuse, electrolyte abnormality, alcohol dependence  Patient presenting to the ER for evaluation of symptoms as described above.  Based on symptoms, risk factors and considered above differential, this presenting complaint could reflect a potentially life-threatening illness therefore the patient will be placed on continuous pulse oximetry and telemetry for monitoring.  Laboratory evaluation will be sent to evaluate for the above complaints.  No evidence of any trauma.  Patient is intoxicated will observe in the ER until clinically sober and safe for discharge.   Clinical Course as of 03/09/23 1321  Mon Mar 09, 2023  1321 Patient ambulating with a gait.  Was given IV fluids for mild hyponatremia.  Father is here to provide safe transport home and feels comfortable taking the patient home. [PR]  Clinical Course User Index [PR] Willy Eddy, MD     FINAL CLINICAL IMPRESSION(S) / ED DIAGNOSES   Final diagnoses:  ETOH abuse     Rx / DC Orders   ED Discharge Orders     None        Note:  This document was prepared using Dragon voice recognition software and may include unintentional dictation errors.    Willy Eddy, MD 03/09/23 1200

## 2023-03-09 NOTE — ED Notes (Signed)
Red top sent to lab.  

## 2023-03-09 NOTE — ED Notes (Signed)
Patient repeatedly trying to get out of bed.   Patient up out of bed asking to use bathroom. Patient given urinal and peed all over floor and had episode of diarrhea in floor

## 2023-06-03 ENCOUNTER — Emergency Department
Admission: EM | Admit: 2023-06-03 | Discharge: 2023-06-03 | Disposition: A | Discharge status: Home / Self Care | Payer: BC Managed Care – PPO | Attending: Emergency Medicine | Admitting: Emergency Medicine

## 2023-06-03 DIAGNOSIS — F10129 Alcohol abuse with intoxication, unspecified: Secondary | ICD-10-CM | POA: Diagnosis present

## 2023-06-03 DIAGNOSIS — Y908 Blood alcohol level of 240 mg/100 ml or more: Secondary | ICD-10-CM | POA: Diagnosis not present

## 2023-06-03 DIAGNOSIS — F10920 Alcohol use, unspecified with intoxication, uncomplicated: Secondary | ICD-10-CM

## 2023-06-03 LAB — COMPREHENSIVE METABOLIC PANEL
ALT: 95 U/L — ABNORMAL HIGH (ref 0–44)
AST: 62 U/L — ABNORMAL HIGH (ref 15–41)
Albumin: 4.4 g/dL (ref 3.5–5.0)
Alkaline Phosphatase: 152 U/L — ABNORMAL HIGH (ref 38–126)
Anion gap: 14 (ref 5–15)
BUN: 5 mg/dL — ABNORMAL LOW (ref 6–20)
CO2: 24 mmol/L (ref 22–32)
Calcium: 8.5 mg/dL — ABNORMAL LOW (ref 8.9–10.3)
Chloride: 96 mmol/L — ABNORMAL LOW (ref 98–111)
Creatinine, Ser: 0.51 mg/dL — ABNORMAL LOW (ref 0.61–1.24)
GFR, Estimated: >60 mL/min (ref >60)
Glucose, Bld: 115 mg/dL — ABNORMAL HIGH (ref 70–99)
Potassium: 3.9 mmol/L (ref 3.5–5.1)
Sodium: 134 mmol/L — ABNORMAL LOW (ref 135–145)
Total Bilirubin: 0.9 mg/dL (ref <1.2)
Total Protein: 8.5 g/dL — ABNORMAL HIGH (ref 6.5–8.1)

## 2023-06-03 LAB — URINE DRUG SCREEN, QUALITATIVE (ARMC ONLY)
Amphetamines, Ur Screen: NOT DETECTED
Barbiturates, Ur Screen: NOT DETECTED
Benzodiazepine, Ur Scrn: NOT DETECTED
Cannabinoid 50 Ng, Ur ~~LOC~~: NOT DETECTED
Cocaine Metabolite,Ur ~~LOC~~: NOT DETECTED
MDMA (Ecstasy)Ur Screen: NOT DETECTED
Methadone Scn, Ur: NOT DETECTED
Opiate, Ur Screen: NOT DETECTED
Phencyclidine (PCP) Ur S: NOT DETECTED
Tricyclic, Ur Screen: NOT DETECTED

## 2023-06-03 LAB — CBC
HCT: 41.1 % (ref 39.0–52.0)
Hemoglobin: 14.5 g/dL (ref 13.0–17.0)
MCH: 30.3 pg (ref 26.0–34.0)
MCHC: 35.3 g/dL (ref 30.0–36.0)
MCV: 86 fL (ref 80.0–100.0)
Platelets: 265 10*3/uL (ref 150–400)
RBC: 4.78 MIL/uL (ref 4.22–5.81)
RDW: 11.9 % (ref 11.5–15.5)
WBC: 12.9 10*3/uL — ABNORMAL HIGH (ref 4.0–10.5)
nRBC: 0 % (ref 0.0–0.2)

## 2023-06-03 LAB — ETHANOL: Alcohol, Ethyl (B): 449 mg/dL (ref <10)

## 2023-06-03 NOTE — ED Provider Notes (Signed)
[facility] Provider Note   Event Date/Time   First MD Initiated Contact with Patient 06/03/23 1404     (approximate) History  Alcohol Intoxication  HPI Billy Frost is a 37 y.o. male with a past medical history of alcohol abuse who presents under law enforcement custody after being arrested for indecent exposure and stating that he drank alcohol.  Patient arrives clinically intoxicated with only complaints of being bruising to the right anterior thigh.  No other complaints at this time ROS: Patient currently denies any vision changes, tinnitus, difficulty speaking, facial droop, sore throat, chest pain, shortness of breath, abdominal pain, nausea/vomiting/diarrhea, dysuria, or weakness/numbness/paresthesias in any extremity   Physical Exam  Triage Vital Signs: ED Triage Vitals  Encounter Vitals Group     BP 06/03/23 1332 (!) 165/94     Systolic BP Percentile --      Diastolic BP Percentile --      Pulse Rate 06/03/23 1332 (!) 122     Resp 06/03/23 1332 16     Temp 06/03/23 1332 98.5 F (36.9 C)     Temp Source 06/03/23 1332 Axillary     SpO2 06/03/23 1332 100 %     Weight --      Height --      Head Circumference --      Peak Flow --      Pain Score 06/03/23 1333 0     Pain Loc --      Pain Education --      Exclude from Growth Chart --    Most recent vital signs: Vitals:   06/03/23 1332 06/03/23 1604  BP: (!) 165/94 (!) 143/82  Pulse: (!) 122 (!) 105  Resp: 16 17  Temp: 98.5 F (36.9 C) 98.6 F (37 C)  SpO2: 100% 99%   General: Awake, unsteady on his feet with slurred speech CV:  Good peripheral perfusion.  Resp:  Normal effort.  Abd:  No distention.  Other:  Middle-aged overweight Caucasian male resting comfortably in no acute distress ED Results / Procedures / Treatments  Labs (all labs ordered are listed, but only abnormal results are displayed) Labs Reviewed  COMPREHENSIVE METABOLIC PANEL - Abnormal; Notable for the following  components:      Result Value   Sodium 134 (*)    Chloride 96 (*)    Glucose, Bld 115 (*)    BUN 5 (*)    Creatinine, Ser 0.51 (*)    Calcium 8.5 (*)    Total Protein 8.5 (*)    AST 62 (*)    ALT 95 (*)    Alkaline Phosphatase 152 (*)    All other components within normal limits  ETHANOL - Abnormal; Notable for the following components:   Alcohol, Ethyl (B) 449 (*)    All other components within normal limits  CBC - Abnormal; Notable for the following components:   WBC 12.9 (*)    All other components within normal limits  URINE DRUG SCREEN, QUALITATIVE ([facility] ONLY)   PROCEDURES: Critical Care performed: No Procedures MEDICATIONS ORDERED IN ED: Medications - No data to display IMPRESSION / MDM / ASSESSMENT AND PLAN / ED COURSE  I reviewed the triage vital signs and the nursing notes.                             The patient is on the cardiac monitor to evaluate for evidence of arrhythmia  and/or significant heart rate changes. Patient's presentation is most consistent with acute presentation with potential threat to life or bodily function. Presents with altered mental status. +Slurred, sluggish behavior. Stated EtOH intoxication. Airway maintained. Unlikely intracranial bleed, opioid intoxication or coingestion, sepsis, hypothyroidism. Suspect likely transient course of intoxication with expected  improvement of symptoms as patient metabolizes offending agent.  Plan: frequent reassessments  Care of this patient will be signed out to the oncoming physician at the end of my shift.  All pertinent patient information conveyed and all questions answered.  All further care and disposition decisions will be made by the oncoming physician.  Clinical Course as of 06/04/23 1633  Wed Jun 03, 2023  1404 Urine Drug Screen, Qualitative [EB]  1508 Intoxication with plan to dc to jail once clinically sober.  [SM]    Clinical Course User Index [EB] Vicente Males, Clent Jacks, MD [SM] Corena Herter, MD   FINAL CLINICAL IMPRESSION(S) / ED DIAGNOSES   Final diagnoses:  Alcoholic intoxication without complication (HCC)   Rx / DC Orders   ED Discharge Orders     None      Note:  This document was prepared using Dragon voice recognition software and may include unintentional dictation errors.   Merwyn Katos, MD 06/04/23 [phone]

## 2023-06-03 NOTE — ED Provider Notes (Signed)
Care assumed of patient from outgoing provider.  See their note for initial history, exam and plan.  Clinical Course as of 06/03/23 1556  Wed Jun 03, 2023  1404 Urine Drug Screen, Qualitative [EB]  1508 Intoxication with plan to dc to jail once clinically sober.  [SM]    Clinical Course User Index [EB] Vicente Males Clent Jacks, MD [SM] Corena Herter, MD   Ambulating in the hallway without any difficulties.  Able to tolerate p.o.  Appears clinically sober.  Safe to be discharged to jail.   Corena Herter, MD 06/03/23 [phone]

## 2023-06-03 NOTE — ED Notes (Signed)
Patient observed walking to and back from the restroom with steady gait. Denies any N&V at this time. Officer at patient bedside.

## 2023-06-03 NOTE — ED Triage Notes (Signed)
Pt arrives via [employer] in custody for reported indecent exposure. Pt reported that he drank "a lot of alcohol" and ingested 3-4 suboxone strips. Denies SI/HI. 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 Pt very diaphoretic during triage. Asking repetitive questions and appearing altered.

## 2023-06-03 NOTE — ED Notes (Signed)
Patient standing in hallway with Police officer. Patient requested water and graham crackers. All concerns and requests addressed at this time.

## 2024-05-05 ENCOUNTER — Ambulatory Visit: Admitting: Podiatry

## 2024-05-05 ENCOUNTER — Ambulatory Visit (INDEPENDENT_AMBULATORY_CARE_PROVIDER_SITE_OTHER)

## 2024-05-05 ENCOUNTER — Encounter: Payer: Self-pay | Admitting: Podiatry

## 2024-05-05 DIAGNOSIS — M722 Plantar fascial fibromatosis: Secondary | ICD-10-CM | POA: Diagnosis not present

## 2024-05-05 DIAGNOSIS — B49 Unspecified mycosis: Secondary | ICD-10-CM

## 2024-05-05 MED ORDER — DICLOFENAC SODIUM 75 MG PO TBEC: 75.0000 mg | DELAYED_RELEASE_TABLET | Freq: Two times a day (BID) | ORAL | 2 refills | Status: DC

## 2024-05-05 MED ORDER — TRIAMCINOLONE ACETONIDE 10 MG/ML IJ SUSP
10.0000 mg | Freq: Once | INTRAMUSCULAR | Status: AC
  Administered 2024-05-05: 10 mg via INTRA_ARTICULAR

## 2024-05-05 MED ORDER — CLOTRIMAZOLE-BETAMETHASONE 1-0.05 % EX CREA: 1.0000 | TOPICAL_CREAM | Freq: Every day | CUTANEOUS | 0 refills | Status: AC

## 2024-05-06 NOTE — Progress Notes (Signed)
 Subjective:   Patient ID: Billy Frost, male   DOB: 38 y.o.   MRN: [id number]   HPI Patient states he has had severe pain in his right heel for 2 months and it is affecting his ability to be active.  Patient takes ibuprofen to try to help he likes to be active and it is worse when he gets up in the morning and after periods of sitting.  Patient does not smoke   Review of Systems  All other systems reviewed and are negative.       Objective:  Physical Exam Vitals and nursing note reviewed.  Constitutional:      Appearance: He is well-developed.  Pulmonary:     Effort: Pulmonary effort is normal.  Musculoskeletal:        General: Normal range of motion.  Skin:    General: Skin is warm.  Neurological:     Mental Status: He is alert.     Neurovascular status intact muscle strength found to be adequate range of motion adequate with exquisite discomfort in the medial fascial band right at the insertional point of the tendon calcaneus with fluid buildup around the area mild depression of the arch good digital perfusion mild equinus and patient well-oriented     Assessment:  Acute plantar fasciitis right with inflammation fluid of the medial band     Plan:  H&P reviewed and at this point I did do sterile prep injected the medial band of the fascia 3 mg Kenalog 5 mg Xylocaine I applied fascial brace to elevate the arch properly fitted into the arch I instructed on supportive shoe gear and stretching exercises and reappoint to recheck in the next several weeks may require long-term orthotics  X-rays indicate small spur no indication stress fracture or arthritis

## 2024-05-11 ENCOUNTER — Encounter: Payer: Self-pay | Admitting: Podiatry

## 2024-05-11 ENCOUNTER — Ambulatory Visit (INDEPENDENT_AMBULATORY_CARE_PROVIDER_SITE_OTHER): Admitting: Podiatry

## 2024-05-11 DIAGNOSIS — M722 Plantar fascial fibromatosis: Secondary | ICD-10-CM

## 2024-05-12 NOTE — Progress Notes (Signed)
 Subjective:   Patient ID: Billy Frost, male   DOB: 38 y.o.   MRN: [id number]   HPI Patient presents stating that he is doing much better with only minimal discomfort.  He is back to doing his exercising with minimal discomfort and is very pleased so far   ROS      Objective:  Physical Exam  Neurovascular status intact significant diminishment of discomfort plantar aspect right heel.  Mild discomfort but very manageable     Assessment:  Acute plantar fasciitis improved mild history chronic plantar fasciitis with patient who is trying to be very active and lose weight     Plan:  H&P reviewed at great length condition weight loss and exercise.  I anticipate he will be able to return to normal activity we did discuss oral anti-inflammatories and I am going to have him reduce his dosage to 1 day 1 the next and then 1 each day.  All questions answered and he may require further treatment depending on his response to increased activity

## 2024-07-16 ENCOUNTER — Other Ambulatory Visit: Payer: Self-pay | Admitting: Podiatry
